# Patient Record
Sex: Male | Born: 1975
Health system: Southern US, Community
[De-identification: ages and names within clinical notes are randomized; demographics above are authoritative.]

## PROBLEM LIST (undated history)

## (undated) DIAGNOSIS — Z973 Presence of spectacles and contact lenses: Secondary | ICD-10-CM

## (undated) DIAGNOSIS — G43909 Migraine, unspecified, not intractable, without status migrainosus: Secondary | ICD-10-CM

## (undated) DIAGNOSIS — G8929 Other chronic pain: Secondary | ICD-10-CM

## (undated) DIAGNOSIS — N471 Phimosis: Secondary | ICD-10-CM

## (undated) DIAGNOSIS — N3289 Other specified disorders of bladder: Secondary | ICD-10-CM

## (undated) HISTORY — PX: FOOT SURGERY: SHX648

## (undated) HISTORY — PX: PATELLA FRACTURE SURGERY: SHX735

---
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
## 2001-03-02 ENCOUNTER — Emergency Department (HOSPITAL_COMMUNITY): Admission: EM | Admit: 2001-03-02 | Discharge: 2001-03-02 | Payer: Self-pay | Admitting: Emergency Medicine

## 2001-03-07 ENCOUNTER — Emergency Department (HOSPITAL_COMMUNITY): Admission: EM | Admit: 2001-03-07 | Discharge: 2001-03-07 | Payer: Self-pay | Admitting: Emergency Medicine

## 2001-03-12 ENCOUNTER — Inpatient Hospital Stay (HOSPITAL_COMMUNITY): Admission: RE | Admit: 2001-03-12 | Discharge: 2001-03-13 | Payer: Self-pay | Admitting: General Surgery

## 2001-03-14 ENCOUNTER — Encounter (HOSPITAL_COMMUNITY): Admission: RE | Admit: 2001-03-14 | Discharge: 2001-04-13 | Payer: Self-pay | Admitting: Pediatrics

## 2001-06-24 ENCOUNTER — Emergency Department (HOSPITAL_COMMUNITY): Admission: EM | Admit: 2001-06-24 | Discharge: 2001-06-24 | Payer: Self-pay | Admitting: Emergency Medicine

## 2002-02-15 ENCOUNTER — Emergency Department (HOSPITAL_COMMUNITY): Admission: EM | Admit: 2002-02-15 | Discharge: 2002-02-15 | Payer: Self-pay | Admitting: *Deleted

## 2002-11-27 ENCOUNTER — Emergency Department (HOSPITAL_COMMUNITY): Admission: EM | Admit: 2002-11-27 | Discharge: 2002-11-28 | Payer: Self-pay

## 2002-11-29 ENCOUNTER — Encounter: Payer: Self-pay | Admitting: Emergency Medicine

## 2002-11-29 ENCOUNTER — Emergency Department (HOSPITAL_COMMUNITY): Admission: EM | Admit: 2002-11-29 | Discharge: 2002-11-29 | Payer: Self-pay | Admitting: Emergency Medicine

## 2003-01-06 ENCOUNTER — Encounter
Admission: RE | Admit: 2003-01-06 | Discharge: 2003-04-06 | Payer: Self-pay | Admitting: Physical Medicine and Rehabilitation

## 2011-03-22 ENCOUNTER — Emergency Department (HOSPITAL_COMMUNITY)
Admission: EM | Admit: 2011-03-22 | Discharge: 2011-03-22 | Disposition: A | Discharge status: Home / Self Care | Payer: No Typology Code available for payment source | Attending: Emergency Medicine | Admitting: Emergency Medicine

## 2011-03-22 ENCOUNTER — Emergency Department (HOSPITAL_COMMUNITY): Payer: Self-pay

## 2011-03-22 DIAGNOSIS — M542 Cervicalgia: Secondary | ICD-10-CM | POA: Insufficient documentation

## 2011-03-22 DIAGNOSIS — M545 Low back pain, unspecified: Secondary | ICD-10-CM | POA: Insufficient documentation

## 2011-03-22 DIAGNOSIS — S335XXA Sprain of ligaments of lumbar spine, initial encounter: Secondary | ICD-10-CM | POA: Insufficient documentation

## 2011-03-22 DIAGNOSIS — S139XXA Sprain of joints and ligaments of unspecified parts of neck, initial encounter: Secondary | ICD-10-CM | POA: Insufficient documentation

## 2011-04-09 ENCOUNTER — Emergency Department (HOSPITAL_COMMUNITY)
Admission: EM | Admit: 2011-04-09 | Discharge: 2011-04-09 | Disposition: A | Discharge status: Home / Self Care | Payer: Medicaid Other | Attending: Emergency Medicine | Admitting: Emergency Medicine

## 2011-04-09 ENCOUNTER — Emergency Department (HOSPITAL_COMMUNITY): Payer: Medicaid Other

## 2011-04-09 DIAGNOSIS — M25579 Pain in unspecified ankle and joints of unspecified foot: Secondary | ICD-10-CM | POA: Insufficient documentation

## 2011-04-09 DIAGNOSIS — X500XXA Overexertion from strenuous movement or load, initial encounter: Secondary | ICD-10-CM | POA: Insufficient documentation

## 2011-04-09 DIAGNOSIS — M79609 Pain in unspecified limb: Secondary | ICD-10-CM | POA: Insufficient documentation

## 2011-04-09 DIAGNOSIS — Y9367 Activity, basketball: Secondary | ICD-10-CM | POA: Insufficient documentation

## 2011-04-09 DIAGNOSIS — M25473 Effusion, unspecified ankle: Secondary | ICD-10-CM | POA: Insufficient documentation

## 2011-04-09 DIAGNOSIS — M25476 Effusion, unspecified foot: Secondary | ICD-10-CM | POA: Insufficient documentation

## 2011-04-09 DIAGNOSIS — S93609A Unspecified sprain of unspecified foot, initial encounter: Secondary | ICD-10-CM | POA: Insufficient documentation

## 2011-04-19 ENCOUNTER — Encounter: Payer: Self-pay | Admitting: *Deleted

## 2011-04-19 ENCOUNTER — Emergency Department (HOSPITAL_COMMUNITY)
Admission: EM | Admit: 2011-04-19 | Discharge: 2011-04-19 | Disposition: A | Discharge status: Home / Self Care | Payer: Medicaid Other | Attending: Emergency Medicine | Admitting: Emergency Medicine

## 2011-04-19 ENCOUNTER — Emergency Department (HOSPITAL_COMMUNITY): Payer: Medicaid Other

## 2011-04-19 DIAGNOSIS — Y9239 Other specified sports and athletic area as the place of occurrence of the external cause: Secondary | ICD-10-CM | POA: Insufficient documentation

## 2011-04-19 DIAGNOSIS — W19XXXA Unspecified fall, initial encounter: Secondary | ICD-10-CM | POA: Insufficient documentation

## 2011-04-19 DIAGNOSIS — Y92838 Other recreation area as the place of occurrence of the external cause: Secondary | ICD-10-CM | POA: Insufficient documentation

## 2011-04-19 DIAGNOSIS — S93409A Sprain of unspecified ligament of unspecified ankle, initial encounter: Secondary | ICD-10-CM

## 2011-04-19 DIAGNOSIS — F17200 Nicotine dependence, unspecified, uncomplicated: Secondary | ICD-10-CM | POA: Insufficient documentation

## 2011-04-19 MED ORDER — HYDROCODONE-ACETAMINOPHEN 5-325 MG PO TABS
ORAL_TABLET | ORAL | Status: AC
Start: 2011-04-19 — End: 2011-04-29

## 2011-04-19 MED ORDER — IBUPROFEN 800 MG PO TABS: 800.0000 mg | ORAL_TABLET | Freq: Three times a day (TID) | ORAL | Status: AC

## 2011-04-19 NOTE — ED Notes (Signed)
XL ASO splint applied to left ankle. Pulse strong and skin intact.

## 2011-04-19 NOTE — ED Notes (Signed)
Pt c/o pain in his left ankle. Pt states that he was playing basketball last week and injured it. States that he was seen at [facility] last Thursday and had it x-rayed and was told that it was sprained. Pt c/o continuing pain and tenderness and states that it has not gotten any better.

## 2011-04-19 NOTE — ED Notes (Signed)
Pt a/ox4. Resp even and unlabored. D/C instructions reviewed with pt. Pt verbalized understanding. Pt ambulated with steady gate to POV. 

## 2011-04-19 NOTE — ED Provider Notes (Signed)
History     CSN: [id number] Arrival date & time: 04/19/2011  1:03 PM  Chief Complaint  Patient presents with  . Ankle Pain   HPI Comments: Patient c/o persistent ankle pain and swelling for >1 week.  Was seen at [facility] initially and treated but sx's have not improved.    Patient is a 35 y.o. male presenting with ankle pain. The history is provided by the patient.  Ankle Pain  The incident occurred more than 1 week ago. The incident occurred at the gym. The injury mechanism was torsion and a fall. The pain is present in the left ankle. The pain is moderate. The pain has been constant since onset. Pertinent negatives include no numbness, no inability to bear weight, no loss of motion, no muscle weakness, no loss of sensation and no tingling. He reports no foreign bodies present. The symptoms are aggravated by activity, bearing weight and palpation. He has tried NSAIDs for the symptoms. The treatment provided no relief.    History reviewed. No pertinent past medical history.  Past Surgical History  Procedure Date  . Foot surgery     bilateral  . Patella fracture surgery     right    History reviewed. No pertinent family history.  History  Substance Use Topics  . Smoking status: Current Everyday Smoker    Types: Cigarettes  . Smokeless tobacco: Not on file  . Alcohol Use: No      Review of Systems  Musculoskeletal: Positive for arthralgias. Negative for myalgias, back pain and gait problem.  Skin: Negative.   Neurological: Negative for dizziness, tingling, weakness and numbness.  Hematological: Does not bruise/bleed easily.  All other systems reviewed and are negative.    Physical Exam  BP 118/68  Pulse 68  Temp(Src) 97.7 F (36.5 C) (Oral)  Resp 20  Ht 6' (1.829 m)  Wt 202 lb (91.627 kg)  BMI 27.40 kg/m2  SpO2 100%  Physical Exam  Nursing note and vitals reviewed. Constitutional: He is oriented to person, place, and time. He appears well-developed and  well-nourished. No distress.  HENT:  Head: Normocephalic and atraumatic.  Mouth/Throat: Oropharynx is clear and moist.  Eyes: EOM are normal. Pupils are equal, round, and reactive to light.  Neck: Normal range of motion. No tracheal deviation present.  Cardiovascular: Normal rate, regular rhythm and normal heart sounds.   Pulmonary/Chest: Breath sounds normal.  Musculoskeletal: He exhibits edema and tenderness.  Lymphadenopathy:    He has no cervical adenopathy.  Neurological: He is alert and oriented to person, place, and time. He has normal reflexes. A cranial nerve deficit is present. He exhibits normal muscle tone. Coordination normal.  Skin: Skin is warm and dry.  Psychiatric: He has a normal mood and affect.    ED Course  ORTHOPEDIC INJURY TREATMENT Date/Time: 04/19/2011 2:47 PM Performed by: Trisha Mangle, Reda Citron L. Authorized by: Lorre Nick T Consent: Verbal consent obtained. Risks and benefits: risks, benefits and alternatives were discussed Consent given by: patient Patient understanding: patient states understanding of the procedure being performed Patient consent: the patient's understanding of the procedure matches consent given Imaging studies: imaging studies available Patient identity confirmed: verbally with patient Time out: Immediately prior to procedure a "time out" was called to verify the correct patient, procedure, equipment, support staff and site/side marked as required. Injury location: ankle Location details: left ankle Injury type: soft tissue Pre-procedure neurovascular assessment: neurovascularly intact Pre-procedure distal perfusion: normal Pre-procedure neurological function: normal Pre-procedure range of motion: normal Local  anesthesia used: no Patient sedated: no Immobilization: splint Post-procedure neurovascular assessment: post-procedure neurovascularly intact Post-procedure distal perfusion: normal Post-procedure neurological function:  normal Post-procedure range of motion: normal Patient tolerance: Patient tolerated the procedure well with no immediate complications.    MDM   1440  ttp of the medial and lateral aspects of the left ankle.  Moderate STS.  Sensation intact, CR<2 sec.  DP pulse is strong.  ASO splint also applied.  ED chart from 04/09/11 also reviewed  1440  Previous x-ray also reviewed.    The patient appears reasonably screened and/or stabilized for discharge and I doubt any other medical condition or other [facility] requiring further screening, evaluation, or treatment in the ED at this time prior to discharge.      Dg Ankle Complete Left  04/19/2011  *RADIOLOGY REPORT*  Clinical Data: Left ankle pain, injured playing basketball  LEFT ANKLE COMPLETE - 3+ VIEW  Comparison: 04/09/2011  Findings: Diffuse soft tissue swelling. Osseous mineralization normal. Ankle joint space preserved. No acute fracture, dislocation or bone destruction. Question ankle joint effusion.  IMPRESSION: Soft tissue swelling question ankle joint effusion. No acute bony abnormalities.  Original Report Authenticated By: Lollie Marrow, M.D.   Dg Ankle Complete Left  04/09/2011  *RADIOLOGY REPORT*  Clinical Data: Basketball injury.  LEFT ANKLE COMPLETE - 3+ VIEW  Comparison: None.  Findings: Soft tissue swelling overlying and beneath the lateral malleolus noted, without overlying malleolar fracture observed. The plafond and talar dome appear intact.  There is likely a small tibiotalar joint effusion.  IMPRESSION:  1.  Suspected small tibiotalar joint effusion. 2.  Soft tissue swelling laterally, but without underlying fracture identified.  Original Report Authenticated By: Dellia Cloud, M.D.        Ryder Man L. Yostin Malacara, [place] 04/19/11 1447

## 2011-05-01 NOTE — ED Provider Notes (Signed)
Medical screening examination/treatment/procedure(s) were conducted as a shared visit with non-physician practitioner(s) and myself.  I personally evaluated the patient during the encounter  Dawn Convery T Carnisha Feltz, MD 05/01/11 2331 

## 2011-08-12 ENCOUNTER — Encounter (HOSPITAL_COMMUNITY): Payer: Self-pay | Admitting: *Deleted

## 2011-08-12 ENCOUNTER — Emergency Department (HOSPITAL_COMMUNITY): Payer: Medicaid Other

## 2011-08-12 ENCOUNTER — Emergency Department (HOSPITAL_COMMUNITY)
Admission: EM | Admit: 2011-08-12 | Discharge: 2011-08-12 | Disposition: A | Discharge status: Home / Self Care | Payer: Medicaid Other | Attending: Emergency Medicine | Admitting: Emergency Medicine

## 2011-08-12 DIAGNOSIS — S0285XA Fracture of orbit, unspecified, initial encounter for closed fracture: Secondary | ICD-10-CM

## 2011-08-12 DIAGNOSIS — F17200 Nicotine dependence, unspecified, uncomplicated: Secondary | ICD-10-CM | POA: Insufficient documentation

## 2011-08-12 DIAGNOSIS — S0280XA Fracture of other specified skull and facial bones, unspecified side, initial encounter for closed fracture: Secondary | ICD-10-CM | POA: Insufficient documentation

## 2011-08-12 DIAGNOSIS — S0230XA Fracture of orbital floor, unspecified side, initial encounter for closed fracture: Secondary | ICD-10-CM | POA: Insufficient documentation

## 2011-08-12 MED ORDER — OXYCODONE-ACETAMINOPHEN 5-325 MG PO TABS: 1.0000 | ORAL_TABLET | ORAL | Status: DC | PRN

## 2011-08-12 MED ORDER — OXYCODONE-ACETAMINOPHEN 5-325 MG PO TABS
2.0000 | ORAL_TABLET | Freq: Once | ORAL | Status: AC
  Administered 2011-08-12: 2 via ORAL
  Filled 2011-08-12: qty 2

## 2011-08-12 NOTE — ED Notes (Signed)
Resting w/ ice pack in place to left eye.
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
## 2011-08-22 ENCOUNTER — Encounter (HOSPITAL_COMMUNITY): Payer: Self-pay

## 2011-08-22 ENCOUNTER — Emergency Department (HOSPITAL_COMMUNITY)
Admission: EM | Admit: 2011-08-22 | Discharge: 2011-08-22 | Disposition: A | Discharge status: Home / Self Care | Payer: Medicaid Other | Attending: Emergency Medicine | Admitting: Emergency Medicine

## 2011-08-22 ENCOUNTER — Emergency Department (HOSPITAL_COMMUNITY): Payer: Medicaid Other

## 2011-08-22 DIAGNOSIS — R10812 Left upper quadrant abdominal tenderness: Secondary | ICD-10-CM | POA: Insufficient documentation

## 2011-08-22 DIAGNOSIS — S2239XA Fracture of one rib, unspecified side, initial encounter for closed fracture: Secondary | ICD-10-CM

## 2011-08-22 DIAGNOSIS — R42 Dizziness and giddiness: Secondary | ICD-10-CM | POA: Insufficient documentation

## 2011-08-22 DIAGNOSIS — S2249XA Multiple fractures of ribs, unspecified side, initial encounter for closed fracture: Secondary | ICD-10-CM | POA: Insufficient documentation

## 2011-08-22 DIAGNOSIS — R1012 Left upper quadrant pain: Secondary | ICD-10-CM | POA: Insufficient documentation

## 2011-08-22 DIAGNOSIS — S0180XA Unspecified open wound of other part of head, initial encounter: Secondary | ICD-10-CM | POA: Insufficient documentation

## 2011-08-22 DIAGNOSIS — R079 Chest pain, unspecified: Secondary | ICD-10-CM | POA: Insufficient documentation

## 2011-08-22 DIAGNOSIS — Z9889 Other specified postprocedural states: Secondary | ICD-10-CM | POA: Insufficient documentation

## 2011-08-22 DIAGNOSIS — F17200 Nicotine dependence, unspecified, uncomplicated: Secondary | ICD-10-CM | POA: Insufficient documentation

## 2011-08-22 DIAGNOSIS — R51 Headache: Secondary | ICD-10-CM | POA: Insufficient documentation

## 2011-08-22 DIAGNOSIS — S0181XA Laceration without foreign body of other part of head, initial encounter: Secondary | ICD-10-CM

## 2011-08-22 DIAGNOSIS — M542 Cervicalgia: Secondary | ICD-10-CM | POA: Insufficient documentation

## 2011-08-22 LAB — POCT I-STAT, CHEM 8
Creatinine, Ser: 1.4 mg/dL — ABNORMAL HIGH (ref 0.50–1.35)
Hemoglobin: 15.6 g/dL (ref 13.0–17.0)
Potassium: 4.1 mEq/L (ref 3.5–5.1)
Sodium: 143 mEq/L (ref 135–145)

## 2011-08-22 MED ORDER — LIDOCAINE HCL (PF) 1 % IJ SOLN
2.0000 mL | Freq: Once | INTRAMUSCULAR | Status: AC
  Administered 2011-08-22: 2 mL via INTRADERMAL

## 2011-08-22 MED ORDER — OXYCODONE-ACETAMINOPHEN 5-325 MG PO TABS
1.0000 | ORAL_TABLET | Freq: Once | ORAL | Status: AC
  Administered 2011-08-22: 1 via ORAL

## 2011-08-22 MED ORDER — OXYCODONE-ACETAMINOPHEN 5-325 MG PO TABS
ORAL_TABLET | ORAL | Status: AC
  Administered 2011-08-22: 1 via ORAL
  Filled 2011-08-22: qty 1

## 2011-08-22 MED ORDER — HYDROMORPHONE HCL PF 1 MG/ML IJ SOLN
0.5000 mg | Freq: Once | INTRAMUSCULAR | Status: AC
  Administered 2011-08-22: 0.5 mg via INTRAVENOUS
  Filled 2011-08-22: qty 1

## 2011-08-22 MED ORDER — HYDROCODONE-ACETAMINOPHEN 5-325 MG PO TABS: 1.0000 | ORAL_TABLET | Freq: Four times a day (QID) | ORAL | Status: AC | PRN

## 2011-08-22 MED ORDER — IOHEXOL 300 MG/ML  SOLN
100.0000 mL | Freq: Once | INTRAMUSCULAR | Status: AC | PRN
  Administered 2011-08-22: 100 mL via INTRAVENOUS

## 2011-08-22 MED ORDER — LIDOCAINE HCL (PF) 1 % IJ SOLN
INTRAMUSCULAR | Status: AC
  Administered 2011-08-22: 2 mL via INTRADERMAL
  Filled 2011-08-22: qty 5
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
## 2011-08-22 NOTE — ED Notes (Signed)
Initial vitals charted on wrong pt.
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
## 2011-08-28 ENCOUNTER — Encounter (HOSPITAL_COMMUNITY): Payer: Self-pay | Admitting: *Deleted

## 2011-08-28 ENCOUNTER — Emergency Department (HOSPITAL_COMMUNITY)
Admission: EM | Admit: 2011-08-28 | Discharge: 2011-08-28 | Disposition: A | Discharge status: Home / Self Care | Payer: Self-pay | Attending: Emergency Medicine | Admitting: Emergency Medicine

## 2011-08-28 DIAGNOSIS — Z4802 Encounter for removal of sutures: Secondary | ICD-10-CM | POA: Insufficient documentation

## 2011-08-28 MED ORDER — HYDROCODONE-ACETAMINOPHEN 7.5-500 MG PO TABS: 1.0000 | ORAL_TABLET | ORAL | Status: AC | PRN

## 2011-08-28 NOTE — ED Notes (Signed)
Headache, pain lt lat ribs due to rib fx,  Says he has problem with back pain and numbness of arms and legs at times.  Plans surgery for orbital fx next week.

## 2011-08-29 NOTE — ED Provider Notes (Signed)
History     CSN: [id number]  Arrival date & time 08/28/11  1608   First MD Initiated Contact with Patient 08/28/11 1709      Chief Complaint  Patient presents with  . Suture / Staple Removal    (Consider location/radiation/quality/duration/timing/severity/associated sxs/prior treatment) Patient is a 36 y.o. male presenting with suture removal. The history is provided by the patient.  Suture / Staple Removal  The sutures were placed 3 to 6 days ago. There has been no treatment since the wound repair. There has been no drainage from the wound. There is no redness present. There is no swelling present. The pain has no pain.    History reviewed. No pertinent past medical history.  Past Surgical History  Procedure Date  . Foot surgery     bilateral  . Patella fracture surgery     right    History reviewed. No pertinent family history.  History  Substance Use Topics  . Smoking status: Current Everyday Smoker    Types: Cigarettes  . Smokeless tobacco: Not on file  . Alcohol Use: No      Review of Systems  Constitutional: Negative for fever.  HENT: Negative for congestion, sore throat and neck pain.   Eyes: Negative.   Respiratory: Negative for chest tightness and shortness of breath.   Cardiovascular: Positive for chest pain.  Gastrointestinal: Negative for nausea and abdominal pain.  Genitourinary: Negative.   Musculoskeletal: Negative for joint swelling and arthralgias.  Skin: Positive for wound. Negative for rash.  Neurological: Negative for dizziness, weakness, light-headedness, numbness and headaches.  Hematological: Negative.   Psychiatric/Behavioral: Negative.     Allergies  Review of patient's allergies indicates no known allergies.  Home Medications   Current Outpatient Rx  Name Route Sig Dispense Refill  . HYDROCODONE-ACETAMINOPHEN 5-325 MG PO TABS Oral Take 1 tablet by mouth every 6 (six) hours as needed for pain. 20 tablet 0  .  HYDROCODONE-ACETAMINOPHEN 7.5-500 MG PO TABS Oral Take 1 tablet by mouth every 4 (four) hours as needed for pain. 30 tablet 0    BP 137/87  Temp(Src) 98.6 F (37 C) (Oral)  Resp 18  Wt 205 lb (92.987 kg)  SpO2 100%  Physical Exam  Nursing note and vitals reviewed. Constitutional: He is oriented to person, place, and time. He appears well-developed and well-nourished.  HENT:  Head: Normocephalic.  Eyes: Conjunctivae are normal.  Neck: Normal range of motion.  Cardiovascular: Normal rate, regular rhythm, normal heart sounds and intact distal pulses.   Pulmonary/Chest: Effort normal and breath sounds normal. He has no wheezes.  Musculoskeletal: Normal range of motion.  Neurological: He is alert and oriented to person, place, and time.  Skin: Skin is warm and dry.       Well healed laceration above left eyebrow  Psychiatric: He has a normal mood and affect.    ED Course  SUTURE REMOVAL Performed by: Serrita Lueth L Authorized by: Candis Musa Consent: Verbal consent obtained. Risks and benefits: risks, benefits and alternatives were discussed Consent given by: patient Time out: Immediately prior to procedure a "time out" was called to verify the correct patient, procedure, equipment, support staff and site/side marked as required. Body area: head/neck Location details: left eyebrow Wound Appearance: clean Sutures Removed: 3 Facility: sutures placed in this facility Patient tolerance: Patient tolerated the procedure well with no immediate complications.   (including critical care time)  Labs Reviewed - No data to display No results found.   1.  Visit for suture removal       MDM  Patient with continued pain in left posterior rib cage secondary to rib fractures obtained with laceration injury.  Will prescribe oxycodone.  Scheduled for surgery next week with ENT (for orbital fracture).          Candis Musa, PA 08/29/11 0300  Candis Musa, PA 08/29/11 0300

## 2011-08-30 NOTE — ED Provider Notes (Signed)
Medical screening examination/treatment/procedure(s) were performed by non-physician practitioner and as supervising physician I was immediately available for consultation/collaboration.  Hedi Barkan S. Aulden Calise, MD 08/30/11 1553 

## 2012-04-04 ENCOUNTER — Emergency Department (HOSPITAL_COMMUNITY)
Admission: EM | Admit: 2012-04-04 | Discharge: 2012-04-04 | Disposition: A | Discharge status: Home / Self Care | Payer: Medicaid Other | Attending: Emergency Medicine | Admitting: Emergency Medicine

## 2012-04-04 ENCOUNTER — Emergency Department (HOSPITAL_COMMUNITY): Payer: Medicaid Other

## 2012-04-04 ENCOUNTER — Encounter (HOSPITAL_COMMUNITY): Payer: Self-pay | Admitting: *Deleted

## 2012-04-04 DIAGNOSIS — Y93B3 Activity, free weights: Secondary | ICD-10-CM | POA: Insufficient documentation

## 2012-04-04 DIAGNOSIS — F17200 Nicotine dependence, unspecified, uncomplicated: Secondary | ICD-10-CM | POA: Insufficient documentation

## 2012-04-04 DIAGNOSIS — W208XXA Other cause of strike by thrown, projected or falling object, initial encounter: Secondary | ICD-10-CM | POA: Insufficient documentation

## 2012-04-04 DIAGNOSIS — S20219A Contusion of unspecified front wall of thorax, initial encounter: Secondary | ICD-10-CM

## 2012-04-04 MED ORDER — TRAMADOL HCL 50 MG PO TABS: 50.0000 mg | ORAL_TABLET | Freq: Four times a day (QID) | ORAL | Status: AC | PRN

## 2012-04-04 NOTE — Discharge Instructions (Signed)
As discussed, please return for any concerning changes in your condition.  Please take ibuprofen, 600 mg, every 8 hours for the next 3 days.  You may use the tramadol for pain that does not improve with ibuprofen.  Please use your incentive spirometer every 6 hours for the next 3 days

## 2012-04-04 NOTE — ED Notes (Signed)
Pt educated on how to use incentive spirometer, pt demonstrated correct technique

## 2012-04-04 NOTE — ED Provider Notes (Signed)
History    This chart was scribed for Gerhard Munch, MD, MD by Smitty Pluck. The patient was seen in room TR05C and the patient's care was started at 3:23PM.   CSN: [id number]  Arrival date & time 04/04/12  1350   None     Chief Complaint  Patient presents with  . Chest Injury    barbell of 200-215 lbs    (Consider location/radiation/quality/duration/timing/severity/associated sxs/prior treatment) The history is provided by the patient.   Adam Snow is a 36 y.o. male who presents to the Emergency Department complaining of chest injury onset 10:45AM. Pt was working out at gym and dropping 215 lb barbell on his chest. Pt reports constant, moderate chest pain. Denies radiation. Reports pain is aggravated by deep breathing. Rates pain at 8/10. Denies taking medication for pain PTA.   History reviewed. No pertinent past medical history.  Past Surgical History  Procedure Date  . Foot surgery     bilateral  . Patella fracture surgery     right    No family history on file.  History  Substance Use Topics  . Smoking status: Current Everyday Smoker    Types: Cigarettes  . Smokeless tobacco: Not on file  . Alcohol Use: No      Review of Systems  Constitutional:       Per HPI, otherwise negative  HENT:       Per HPI, otherwise negative  Eyes: Negative.   Respiratory:       Per HPI, otherwise negative  Cardiovascular:       Per HPI, otherwise negative  Gastrointestinal: Negative for vomiting.  Genitourinary: Negative.   Musculoskeletal:       Per HPI, otherwise negative  Skin: Negative.   Neurological: Negative for syncope.    Allergies  Review of patient's allergies indicates no known allergies.  Home Medications   Current Outpatient Rx  Name Route Sig Dispense Refill  . OVER THE COUNTER MEDICATION Left Eye Place 1 drop into the left eye every morning. OTC Eye drop for irritation    . OVER THE COUNTER MEDICATION Left Eye Place 2 drops into the  left eye at bedtime. OTC eye drop for lubrication      BP 122/70  Pulse 52  Temp 98.2 F (36.8 C) (Oral)  Resp 18  SpO2 98%  Physical Exam  Nursing note and vitals reviewed. Constitutional: He is oriented to person, place, and time. He appears well-developed and well-nourished.  HENT:  Head: Normocephalic and atraumatic.  Cardiovascular: Normal rate, regular rhythm and normal heart sounds.   Pulmonary/Chest: Effort normal and breath sounds normal. No respiratory distress. He exhibits tenderness (mid sternal).  Neurological: He is alert and oriented to person, place, and time.  Skin: Skin is warm and dry.  Psychiatric: He has a normal mood and affect. His behavior is normal.    ED Course  Procedures (including critical care time)  COORDINATION OF CARE: 3:26PM EDP discusses pt ED treatment with pt     Labs Reviewed - No data to display Dg Chest 2 View  04/04/2012  *RADIOLOGY REPORT*  Clinical Data: Chest pain following weight lifting injury  CHEST - 2 VIEW  Comparison: Chest CT - 08/22/2011  Findings: Normal cardiac silhouette and mediastinal contours.  No focal parenchymal opacities.  No definite pleural effusion or pneumothorax.  Redemonstrated incomplete fusion of the mid aspect of the sternum with associated degenerative change at this location, unchanged compared to remote chest CT.  No  acute osseous abnormalities.  IMPRESSION: No acute cardiopulmonary disease.  Original Report Authenticated By: Waynard Reeds, M.D.     No diagnosis found.  I reviewed the cxr with the patient.  MDM  I personally performed the services described in this documentation, which was scribed in my presence. The recorded information has been reviewed and considered.  The patient presents after sustaining trauma to his chest.  On my exam the patient is in no distress.  He does have mild tenderness to palpation about the anterior chest, but no hypoxia, tachypnea, or tachycardia.  The patient's  x-ray did not demonstrate changes compared to the recent CT.  He was counseled on the need to use his incentive parameter, and to return for any concerning changes in his condition, discharged in stable condition.  Gerhard Munch, MD 04/04/12 1534

## 2012-04-04 NOTE — ED Notes (Signed)
Pt had barbell fall on chest today that weight 200-215 pounds.  No markings.  Pain with palpation and movement.  Hurts to hiccup and with deep breathing.

## 2012-04-04 NOTE — ED Notes (Signed)
MD at bedside. 

## 2012-12-15 ENCOUNTER — Emergency Department (HOSPITAL_COMMUNITY)
Admission: EM | Admit: 2012-12-15 | Discharge: 2012-12-15 | Disposition: A | Discharge status: Home / Self Care | Payer: Medicaid - Out of State | Attending: Emergency Medicine | Admitting: Emergency Medicine

## 2012-12-15 ENCOUNTER — Encounter (HOSPITAL_COMMUNITY): Payer: Self-pay | Admitting: *Deleted

## 2012-12-15 DIAGNOSIS — F17200 Nicotine dependence, unspecified, uncomplicated: Secondary | ICD-10-CM | POA: Insufficient documentation

## 2012-12-15 DIAGNOSIS — G43909 Migraine, unspecified, not intractable, without status migrainosus: Secondary | ICD-10-CM

## 2012-12-15 DIAGNOSIS — H53149 Visual discomfort, unspecified: Secondary | ICD-10-CM | POA: Insufficient documentation

## 2012-12-15 DIAGNOSIS — H532 Diplopia: Secondary | ICD-10-CM | POA: Insufficient documentation

## 2012-12-15 DIAGNOSIS — Z79899 Other long term (current) drug therapy: Secondary | ICD-10-CM | POA: Insufficient documentation

## 2012-12-15 HISTORY — DX: Migraine, unspecified, not intractable, without status migrainosus: G43.909

## 2012-12-15 MED ORDER — IBUPROFEN 600 MG PO TABS: 600.0000 mg | ORAL_TABLET | Freq: Four times a day (QID) | ORAL | Status: DC | PRN

## 2012-12-15 MED ORDER — HYDROCODONE-ACETAMINOPHEN 5-325 MG PO TABS: 1.0000 | ORAL_TABLET | ORAL | Status: DC | PRN

## 2012-12-15 MED ORDER — KETOROLAC TROMETHAMINE 60 MG/2ML IM SOLN
60.0000 mg | Freq: Once | INTRAMUSCULAR | Status: AC
  Administered 2012-12-15: 60 mg via INTRAMUSCULAR
  Filled 2012-12-15: qty 2

## 2012-12-15 MED ORDER — METOCLOPRAMIDE HCL 10 MG PO TABS
10.0000 mg | ORAL_TABLET | Freq: Once | ORAL | Status: AC
  Administered 2012-12-15: 10 mg via ORAL
  Filled 2012-12-15: qty 1

## 2012-12-15 MED ORDER — METOCLOPRAMIDE HCL 10 MG PO TABS: 10.0000 mg | ORAL_TABLET | Freq: Four times a day (QID) | ORAL | Status: DC | PRN

## 2012-12-15 MED ORDER — HYDROCODONE-ACETAMINOPHEN 5-325 MG PO TABS
1.0000 | ORAL_TABLET | Freq: Once | ORAL | Status: AC
  Administered 2012-12-15: 1 via ORAL
  Filled 2012-12-15: qty 1

## 2012-12-15 NOTE — ED Notes (Signed)
Pt states with migraine HA for 2 weeks and with blurry vision and burning eyes also x 2 weeks to bilateral eyes, left eye pain, denies N/V, hx of migraines

## 2012-12-15 NOTE — ED Provider Notes (Signed)
History  This chart was scribed for Adam Racer, MD by Shari Heritage, ED Scribe. The patient was seen in room APA06/APA06. Patient's care was started at 1547.   CSN: [id number]  Arrival date & time 12/15/12  1525   First MD Initiated Contact with Patient 12/15/12 1547      Chief Complaint  Patient presents with  . Migraine  . Eye Problem    Patient is a 37 y.o. male presenting with eye problem and headaches.  Eye Problem Location:  L eye Severity:  Severe Duration:  2 weeks Timing:  Constant Progression:  Unchanged Chronicity:  New Ineffective treatments:  NSAIDs and eye drops Associated symptoms: blurred vision, headaches and photophobia   Associated symptoms: no nausea and no vomiting   Headache Pain location:  Frontal, L temporal and L parietal Radiates to:  Does not radiate Onset quality:  Gradual Duration:  2 weeks Progression:  Unchanged Context: bright light   Associated symptoms: blurred vision, eye pain and photophobia   Associated symptoms: no abdominal pain, no back pain, no congestion, no cough, no fever, no loss of balance, no nausea, no near-syncope, no seizures, no syncope, no tingling and no vomiting      HPI Comments: LALO TROMP is a 37 y.o. male with history of migraines who presents to the Emergency Department complaining of moderate to severe, left-sided headache and left eye pain onset 2 weeks ago. There is associated blurred vision. He states that he has been taking ibuprofen and applied natural tears and lubricant gel to his eye. He has a history of chronic eye pain secondary to left eye trauma. Per medical records, patient had multiple fractures to the left periorbital floor and was treated for this issue a [facility] in December 2012.  Patient states that migraines have been worse since his injury. Patient sees ophthalmologist, Earlene Plater, at [facility] Patient denies nausea, vomiting, loss of vision, loss of balance, syncope, dizziness,  lightheadedness, chest pain, shortness of breath or any other symptoms. Patient is a current every day smoker and does not use alcohol.  Ophthalmologist - Fayrene Fearing   Past Medical History  Diagnosis Date  . Migraine     Past Surgical History  Procedure Laterality Date  . Foot surgery      bilateral  . Patella fracture surgery      right    History reviewed. No pertinent family history.  History  Substance Use Topics  . Smoking status: Current Every Day Smoker    Types: Cigarettes  . Smokeless tobacco: Not on file  . Alcohol Use: No      Review of Systems  Constitutional: Negative for fever and chills.  HENT: Negative for congestion and rhinorrhea.   Eyes: Positive for blurred vision, photophobia and pain.  Respiratory: Negative for cough and shortness of breath.   Cardiovascular: Negative for chest pain, leg swelling, syncope and near-syncope.  Gastrointestinal: Negative for nausea, vomiting and abdominal pain.  Musculoskeletal: Negative for back pain.  Skin: Negative for rash.  Neurological: Positive for headaches. Negative for seizures and loss of balance.  All other systems reviewed and are negative.    Allergies  Other  Home Medications   Current Outpatient Rx  Name  Route  Sig  Dispense  Refill  . Carboxymethylcellul-Glycerin (OPTIVE) 0.5-0.9 % SOLN   Ophthalmic   Apply 1 drop to eye at bedtime.         . carboxymethylcellulose (REFRESH TEARS) 0.5 % SOLN   Ophthalmic  Apply 1 drop to eye 3 (three) times daily as needed (for drye eye relief).         Marland Kitchen HYDROcodone-acetaminophen (NORCO) 5-325 MG per tablet   Oral   Take 1 tablet by mouth every 4 (four) hours as needed for pain.   10 tablet   0   . ibuprofen (ADVIL,MOTRIN) 600 MG tablet   Oral   Take 1 tablet (600 mg total) by mouth every 6 (six) hours as needed for pain.   30 tablet   0   . metoCLOPramide (REGLAN) 10 MG tablet   Oral   Take 1 tablet (10 mg total) by mouth  every 6 (six) hours as needed (nausea/headache).   6 tablet   0     BP 130/81  Pulse 58  Temp(Src) 98.4 F (36.9 C) (Oral)  Resp 18  Ht 6' (1.829 m)  Wt 205 lb (92.987 kg)  BMI 27.8 kg/m2  SpO2 100%  Physical Exam  Constitutional: He is oriented to person, place, and time. He appears well-developed and well-nourished. No distress.  HENT:  Head: Normocephalic and atraumatic.  Eyes: Conjunctivae, EOM and lids are normal. Pupils are equal, round, and reactive to light.  Neck: Normal range of motion. Neck supple.  Cardiovascular: Normal rate and regular rhythm.   Pulmonary/Chest: Effort normal and breath sounds normal.  Abdominal: Soft. There is no tenderness. There is no rebound and no guarding.  Neurological: He is alert and oriented to person, place, and time.  5/5 strength in all extremities. Sensation intact. CN 2-12 intact. Finger to nose intact bilaterally. 5/5 grip strength.   Skin: Skin is warm and dry.    ED Course  Procedures (including critical care time) DIAGNOSTIC STUDIES: Oxygen Saturation is 100% on room air, normal by my interpretation.    COORDINATION OF CARE: 4:05 PM- Patient informed of current plan for treatment and evaluation and agrees with plan at this time.   5:17 PM- Headache is improved but still complaining of visual disturbance. Has agreed to make an appointment to see ophthalmologist and has already called. Patient is stable and ready for discharge.   1. Migraine   2. Double vision       MDM  I personally performed the services described in this documentation, which was scribed in my presence. The recorded information has been reviewed and is accurate.  Double vision is chronic >1year and is followed by ophthalmologist at [facility]. No acute findings on exam. No entrapment. EOMI. No neuro deficits. Advised to f/u with [facility] MD.   Adam Racer, MD 12/23/12 1650

## 2012-12-15 NOTE — ED Provider Notes (Deleted)
History     CSN: [id number]  Arrival date & time 12/15/12  1525   First MD Initiated Contact with Patient 12/15/12 1547      Chief Complaint  Patient presents with  . Migraine  . Eye Problem    (Consider location/radiation/quality/duration/timing/severity/associated sxs/prior treatment) HPI  Past Medical History  Diagnosis Date  . Migraine     Past Surgical History  Procedure Laterality Date  . Foot surgery      bilateral  . Patella fracture surgery      right    History reviewed. No pertinent family history.  History  Substance Use Topics  . Smoking status: Current Every Day Smoker    Types: Cigarettes  . Smokeless tobacco: Not on file  . Alcohol Use: No      Review of Systems  Allergies  Other  Home Medications   Current Outpatient Rx  Name  Route  Sig  Dispense  Refill  . Carboxymethylcellul-Glycerin (OPTIVE) 0.5-0.9 % SOLN   Ophthalmic   Apply 1 drop to eye at bedtime.         . carboxymethylcellulose (REFRESH TEARS) 0.5 % SOLN   Ophthalmic   Apply 1 drop to eye 3 (three) times daily as needed (for drye eye relief).         Marland Kitchen HYDROcodone-acetaminophen (NORCO) 5-325 MG per tablet   Oral   Take 1 tablet by mouth every 4 (four) hours as needed for pain.   10 tablet   0   . ibuprofen (ADVIL,MOTRIN) 600 MG tablet   Oral   Take 1 tablet (600 mg total) by mouth every 6 (six) hours as needed for pain.   30 tablet   0   . metoCLOPramide (REGLAN) 10 MG tablet   Oral   Take 1 tablet (10 mg total) by mouth every 6 (six) hours as needed (nausea/headache).   6 tablet   0     BP 130/81  Pulse 58  Temp(Src) 98.4 F (36.9 C) (Oral)  Resp 18  Ht 6' (1.829 m)  Wt 205 lb (92.987 kg)  BMI 27.8 kg/m2  SpO2 100%  Physical Exam  ED Course  Procedures (including critical care time)  Labs Reviewed - No data to display No results found.   1. Migraine   2. Double vision       MDM  I personally performed the services described in  this documentation, which was scribed in my presence. The recorded information has been reviewed and is accurate.  Double vision is chronic >1year and is followed by ophthalmologist at [facility]. No acute findings on exam. No entrapment. EOMI. No neuro deficits. Advised to f/u with [facility] MD.         Loren Racer, MD 12/15/12 [phone]

## 2012-12-15 NOTE — ED Notes (Addendum)
Pt states he had fractured eye socket in Dec of last year. Pt currently complains of migraines with burning sensation in both eyes for the past two weeks. Pt states he has used eye drops as well as Ibuprofen to relieve pain and burning but the medication has been ineffective. Pt pupils round equal and reactive to light.

## 2013-03-17 ENCOUNTER — Encounter (HOSPITAL_COMMUNITY): Payer: Self-pay | Admitting: Emergency Medicine

## 2013-03-17 ENCOUNTER — Emergency Department (HOSPITAL_COMMUNITY)
Admission: EM | Admit: 2013-03-17 | Discharge: 2013-03-17 | Disposition: A | Discharge status: Home / Self Care | Payer: Medicaid - Out of State | Attending: Emergency Medicine | Admitting: Emergency Medicine

## 2013-03-17 DIAGNOSIS — IMO0002 Reserved for concepts with insufficient information to code with codable children: Secondary | ICD-10-CM

## 2013-03-17 DIAGNOSIS — Y939 Activity, unspecified: Secondary | ICD-10-CM | POA: Insufficient documentation

## 2013-03-17 DIAGNOSIS — Y990 Civilian activity done for income or pay: Secondary | ICD-10-CM | POA: Insufficient documentation

## 2013-03-17 DIAGNOSIS — F17200 Nicotine dependence, unspecified, uncomplicated: Secondary | ICD-10-CM | POA: Insufficient documentation

## 2013-03-17 DIAGNOSIS — S0100XA Unspecified open wound of scalp, initial encounter: Secondary | ICD-10-CM | POA: Insufficient documentation

## 2013-03-17 DIAGNOSIS — G43909 Migraine, unspecified, not intractable, without status migrainosus: Secondary | ICD-10-CM | POA: Insufficient documentation

## 2013-03-17 DIAGNOSIS — W208XXA Other cause of strike by thrown, projected or falling object, initial encounter: Secondary | ICD-10-CM | POA: Insufficient documentation

## 2013-03-17 DIAGNOSIS — Y929 Unspecified place or not applicable: Secondary | ICD-10-CM | POA: Insufficient documentation

## 2013-03-17 NOTE — ED Notes (Addendum)
PA at bedside to assess and suture.

## 2013-03-17 NOTE — ED Provider Notes (Signed)
History    CSN: [id number] Arrival date & time 03/17/13  1029  First MD Initiated Contact with Patient 03/17/13 1034     Chief Complaint  Patient presents with  . Head Injury   (Consider location/radiation/quality/duration/timing/severity/associated sxs/prior Treatment) HPI Comments: Patient is a 37 year old male who presents today after a brick fell on his head while at work. He states he was putting a door up or himself from about 1 foot above his head on the top of the head. He has a laceration to the back of his head. No loss of consciousness, disorientation, headaches, nausea, vomiting, abdominal pain, numbness, weakness, tingling. Currently he states he is in no pain. No medications PTA. He has been ambulatory. He would like to return to work. Reports he has no significant past medical history including diabetes. His last tetanus shot was 4 weeks ago.  The history is provided by the patient. No language interpreter was used.   Past Medical History  Diagnosis Date  . Migraine    Past Surgical History  Procedure Laterality Date  . Foot surgery      bilateral  . Patella fracture surgery      right   History reviewed. No pertinent family history. History  Substance Use Topics  . Smoking status: Current Every Day Smoker    Types: Cigarettes  . Smokeless tobacco: Not on file  . Alcohol Use: No    Review of Systems  Constitutional: Negative for fever and chills.  Eyes: Negative for photophobia, pain, discharge, redness, itching and visual disturbance.  Respiratory: Negative for shortness of breath.   Cardiovascular: Negative for chest pain.  Gastrointestinal: Negative for nausea, vomiting and abdominal pain.  Skin: Positive for wound.  Neurological: Negative for headaches.  All other systems reviewed and are negative.    Allergies  Other  Home Medications   Current Outpatient Rx  Name  Route  Sig  Dispense  Refill  . Carboxymethylcellul-Glycerin (OPTIVE)  0.5-0.9 % SOLN   Ophthalmic   Apply 1 drop to eye at bedtime.         . carboxymethylcellulose (REFRESH TEARS) 0.5 % SOLN   Ophthalmic   Apply 1 drop to eye 3 (three) times daily as needed (for drye eye relief).         Marland Kitchen HYDROcodone-acetaminophen (NORCO) 5-325 MG per tablet   Oral   Take 1 tablet by mouth every 4 (four) hours as needed for pain.   10 tablet   0   . ibuprofen (ADVIL,MOTRIN) 600 MG tablet   Oral   Take 1 tablet (600 mg total) by mouth every 6 (six) hours as needed for pain.   30 tablet   0   . metoCLOPramide (REGLAN) 10 MG tablet   Oral   Take 1 tablet (10 mg total) by mouth every 6 (six) hours as needed (nausea/headache).   6 tablet   0    BP 137/88  Pulse 67  Temp(Src) 98.3 F (36.8 C) (Oral)  Resp 18  Ht 6' (1.829 m)  Wt 205 lb (92.987 kg)  BMI 27.8 kg/m2  SpO2 98% Physical Exam  Nursing note and vitals reviewed. Constitutional: He is oriented to person, place, and time. He appears well-developed and well-nourished. No distress.  HENT:  Head: Normocephalic.    Right Ear: External ear normal.  Left Ear: External ear normal.  Nose: Nose normal.  Mouth/Throat: Uvula is midline, oropharynx is clear and moist and mucous membranes are normal.  2 cm laceration  of posterior scalp  Eyes: Conjunctivae, EOM and lids are normal. Pupils are equal, round, and reactive to light.  Neck: Normal range of motion. No tracheal deviation present.  Cardiovascular: Normal rate, regular rhythm, normal heart sounds, intact distal pulses and normal pulses.   Pulmonary/Chest: Effort normal and breath sounds normal. No stridor.  Abdominal: Soft. He exhibits no distension. There is no tenderness.  Musculoskeletal: Normal range of motion.  Moves all extremites. 5/5 strength  Neurological: He is alert and oriented to person, place, and time. He has normal strength. No cranial nerve deficit (III-XII) or sensory deficit. He exhibits normal muscle tone. Coordination and  gait normal.  Skin: Skin is warm and dry. He is not diaphoretic.  Psychiatric: He has a normal mood and affect. His behavior is normal.    ED Course  Procedures (including critical care time)  LACERATION REPAIR Performed by: Junious Silk Authorized by: Junious Silk Consent: Verbal consent obtained. Risks and benefits: risks, benefits and alternatives were discussed Consent given by: patient Patient identity confirmed: provided demographic data Prepped and Draped in normal sterile fashion Wound explored  Laceration Location: scalp  Laceration Length: 2 cm  No Foreign Bodies seen or palpated  Anesthesia: local infiltration  Local anesthetic: lidocaine 2%   Anesthetic total: 3 ml  Irrigation method: syringe Amount of cleaning: standard  Skin closure: 4-0 Prolene  Number of sutures: 4  Technique: simple interrupted   Patient tolerance: Patient tolerated the procedure well with no immediate complications.   Labs Reviewed - No data to display No results found. 1. Laceration     MDM  Tdap UTD. Wound cleaning complete with pressure irrigation, bottom of wound visualized, no foreign bodies appreciated. Laceration occurred < 8 hours prior to repair which was well tolerated. Pt has no co morbidities to effect normal wound healing. Discussed suture home care w pt and answered questions. Pt to f-u for wound check and suture removal in 10 days. Pt is hemodynamically stable w no complaints prior to dc. No concern for intracranial injury. No neuro deficits. Return instructions given. Vital signs stable for discharge. Patient / Family / Caregiver informed of clinical course, understand medical decision-making process, and agree with plan.     Mora Bellman, PA-C 03/17/13 1538

## 2013-03-17 NOTE — ED Notes (Signed)
Per EMS: brick fell on pt head very short distance; pt with small abrasion to top of head; no LOC

## 2013-03-17 NOTE — ED Provider Notes (Signed)
Medical screening examination/treatment/procedure(s) were performed by non-physician practitioner and as supervising physician I was immediately available for consultation/collaboration.   Alishba Naples, MD 03/17/13 1604 

## 2013-03-17 NOTE — ED Notes (Signed)
Pt denies nausea, vomiting, LOC, or headache. Denies pain.

## 2013-03-26 ENCOUNTER — Emergency Department (HOSPITAL_COMMUNITY)
Admission: EM | Admit: 2013-03-26 | Discharge: 2013-03-26 | Disposition: A | Discharge status: Home / Self Care | Payer: Medicaid - Out of State | Attending: Emergency Medicine | Admitting: Emergency Medicine

## 2013-03-26 ENCOUNTER — Encounter (HOSPITAL_COMMUNITY): Payer: Self-pay | Admitting: Emergency Medicine

## 2013-03-26 DIAGNOSIS — Z4802 Encounter for removal of sutures: Secondary | ICD-10-CM | POA: Insufficient documentation

## 2013-03-26 DIAGNOSIS — Z8679 Personal history of other diseases of the circulatory system: Secondary | ICD-10-CM | POA: Insufficient documentation

## 2013-03-26 DIAGNOSIS — F17200 Nicotine dependence, unspecified, uncomplicated: Secondary | ICD-10-CM | POA: Insufficient documentation

## 2013-03-26 NOTE — ED Notes (Signed)
Patient here for suture removal from L top of head.   Patient states he had them placed 10 days ago.

## 2013-03-26 NOTE — ED Provider Notes (Signed)
  CSN: [id number]     Arrival date & time 03/26/13  0919 History     First MD Initiated Contact with Patient 03/26/13 [phone]     Chief Complaint  Patient presents with  . Suture / Staple Removal   (Consider location/radiation/quality/duration/timing/severity/associated sxs/prior Treatment) HPI Comments: Patient presents today to have sutures removed from his scalp.  Sutures were placed nine days ago.  He denies any drainage from the area.  Denies pain.  Denies surrounding erythema or edema.  Denies fever or chills.  He has been keeping the area clean and dry, but has not been applying any antibiotic ointment or other treatment.    Patient is a 37 y.o. male presenting with suture removal. The history is provided by the patient.  Suture / Staple Removal This is a new problem.    Past Medical History  Diagnosis Date  . Migraine    Past Surgical History  Procedure Laterality Date  . Foot surgery      bilateral  . Patella fracture surgery      right   No family history on file. History  Substance Use Topics  . Smoking status: Current Every Day Smoker -- 0.50 packs/day    Types: Cigarettes  . Smokeless tobacco: Not on file  . Alcohol Use: No    Review of Systems  Skin: Positive for wound.  All other systems reviewed and are negative.    Allergies  Other  Home Medications   Current Outpatient Rx  Name  Route  Sig  Dispense  Refill  . Carboxymethylcellul-Glycerin (OPTIVE) 0.5-0.9 % SOLN   Ophthalmic   Apply 1 drop to eye at bedtime.         . carboxymethylcellulose (REFRESH TEARS) 0.5 % SOLN   Ophthalmic   Apply 1 drop to eye 3 (three) times daily as needed (for drye eye relief).         Marland Kitchen ibuprofen (ADVIL,MOTRIN) 200 MG tablet   Oral   Take 400 mg by mouth every 6 (six) hours as needed for pain.          BP 118/75  Pulse 69  Temp(Src) 97.2 F (36.2 C) (Oral)  Resp 20  SpO2 97% Physical Exam  Nursing note and vitals reviewed. Constitutional: He appears  well-developed and well-nourished.  HENT:  Head: Normocephalic.  Left sided scalp laceration appears to be healing well.  Skin well approximated.  No drainage from the area.  No surrounding erythema, edema, or warmth.  Neck: Normal range of motion. Neck supple.  Cardiovascular: Normal rate, regular rhythm and normal heart sounds.   Pulmonary/Chest: Effort normal and breath sounds normal.  Neurological: He is alert.  Skin: Skin is warm and dry. No erythema.  Psychiatric: He has a normal mood and affect.    ED Course   Procedures (including critical care time)  Labs Reviewed - No data to display No results found. No diagnosis found.  MDM  Patient presenting for suture removal.  No signs of infection.  Patient stable for discharge.  Pascal Lux [place], PA-C 03/26/13 1103

## 2013-03-26 NOTE — ED Provider Notes (Signed)
Medical screening examination/treatment/procedure(s) were performed by non-physician practitioner and as supervising physician I was immediately available for consultation/collaboration.   Mayjor Ager M Anthonio Mizzell, MD 03/26/13 1845 

## 2013-03-27 ENCOUNTER — Emergency Department (HOSPITAL_COMMUNITY): Payer: Medicaid - Out of State

## 2013-03-27 ENCOUNTER — Emergency Department (HOSPITAL_COMMUNITY)
Admission: EM | Admit: 2013-03-27 | Discharge: 2013-03-27 | Disposition: A | Discharge status: Home / Self Care | Payer: No Typology Code available for payment source | Attending: Emergency Medicine | Admitting: Emergency Medicine

## 2013-03-27 ENCOUNTER — Encounter (HOSPITAL_COMMUNITY): Payer: Self-pay | Admitting: *Deleted

## 2013-03-27 DIAGNOSIS — Y9367 Activity, basketball: Secondary | ICD-10-CM | POA: Insufficient documentation

## 2013-03-27 DIAGNOSIS — S6390XA Sprain of unspecified part of unspecified wrist and hand, initial encounter: Secondary | ICD-10-CM | POA: Insufficient documentation

## 2013-03-27 DIAGNOSIS — X500XXA Overexertion from strenuous movement or load, initial encounter: Secondary | ICD-10-CM | POA: Insufficient documentation

## 2013-03-27 DIAGNOSIS — Z8679 Personal history of other diseases of the circulatory system: Secondary | ICD-10-CM | POA: Insufficient documentation

## 2013-03-27 DIAGNOSIS — F17200 Nicotine dependence, unspecified, uncomplicated: Secondary | ICD-10-CM | POA: Insufficient documentation

## 2013-03-27 DIAGNOSIS — Y9239 Other specified sports and athletic area as the place of occurrence of the external cause: Secondary | ICD-10-CM | POA: Insufficient documentation

## 2013-03-27 DIAGNOSIS — Y92838 Other recreation area as the place of occurrence of the external cause: Secondary | ICD-10-CM | POA: Insufficient documentation

## 2013-03-27 DIAGNOSIS — R296 Repeated falls: Secondary | ICD-10-CM | POA: Insufficient documentation

## 2013-03-27 DIAGNOSIS — S6392XA Sprain of unspecified part of left wrist and hand, initial encounter: Secondary | ICD-10-CM

## 2013-03-27 MED ORDER — HYDROCODONE-ACETAMINOPHEN 5-325 MG PO TABS: 1.0000 | ORAL_TABLET | ORAL | Status: DC | PRN

## 2013-03-27 MED ORDER — IBUPROFEN 800 MG PO TABS: 800.0000 mg | ORAL_TABLET | Freq: Three times a day (TID) | ORAL | Status: DC | PRN

## 2013-03-27 MED ORDER — IBUPROFEN 400 MG PO TABS
800.0000 mg | ORAL_TABLET | Freq: Once | ORAL | Status: AC
  Administered 2013-03-27: 800 mg via ORAL
  Filled 2013-03-27: qty 2

## 2013-03-27 NOTE — ED Notes (Signed)
Hyperextend lt. Fingers back when hitting the gym floor when playing basketball.  Upper x 4 fingers (distal), except the thumb is mildly swollen. Can bend fingers. Cms intact.

## 2013-03-27 NOTE — ED Provider Notes (Signed)
CSN: [id number]     Arrival date & time 03/27/13  2026 History  This chart was scribed for non-physician practitioner Trixie Dredge, PA-C, working with Gerhard Munch, MD, by Yevette Edwards, ED Scribe. This patient was seen in room TR05C/TR05C and the patient's care was started at 9:16 PM.   First MD Initiated Contact with Patient 03/27/13 2052     Chief Complaint  Patient presents with  . Hand Pain    The history is provided by the patient. No language interpreter was used.   HPI Comments: Adam Snow is a 37 y.o. male who presents to the Emergency Department complaining of sudden-onset pain to the four fingers of his left hand which began approximately five hours ago. The pt reports he was playing basketball, and he believes he hyperextended his hand when he caught himself with his left hand to keep from falling upon the gym floor. He rates the pain is a 8/10, and he describes it as "sharp" and "throbbing".  He reports he is experiencing paresthesia to his fingers. The pt denies head impact, LOC, pain to his left thumb, left elbow, left arm, left shoulder, neck, chest, abdomen or back.   Past Medical History  Diagnosis Date  . Migraine    Past Surgical History  Procedure Laterality Date  . Foot surgery      bilateral  . Patella fracture surgery      right   No family history on file. History  Substance Use Topics  . Smoking status: Current Every Day Smoker -- 0.50 packs/day    Types: Cigarettes  . Smokeless tobacco: Not on file  . Alcohol Use: No    Review of Systems  HENT: Negative for neck pain.   Cardiovascular: Negative for chest pain.  Gastrointestinal: Negative for abdominal pain.  Musculoskeletal: Positive for arthralgias (Fingers of left hand). Negative for back pain.  Neurological: Negative for syncope.    Allergies  Other  Home Medications   Current Outpatient Rx  Name  Route  Sig  Dispense  Refill  . Carboxymethylcellul-Glycerin (OPTIVE) 0.5-0.9  % SOLN   Ophthalmic   Apply 1 drop to eye at bedtime.         . carboxymethylcellulose (REFRESH TEARS) 0.5 % SOLN   Ophthalmic   Apply 1 drop to eye 3 (three) times daily as needed (for drye eye relief).         Marland Kitchen ibuprofen (ADVIL,MOTRIN) 200 MG tablet   Oral   Take 400 mg by mouth every 6 (six) hours as needed for pain.          Triage Vitals: BP 115/72  Pulse 71  Temp(Src) 98.2 F (36.8 C) (Oral)  Resp 14  SpO2 96%  Physical Exam  Nursing note and vitals reviewed. Constitutional: He appears well-developed and well-nourished. No distress.  HENT:  Head: Normocephalic and atraumatic.  Neck: Neck supple.  Pulmonary/Chest: Effort normal.  Musculoskeletal:       Left elbow: Normal.       Left wrist: Normal. He exhibits normal range of motion, no tenderness, no swelling, no crepitus and no deformity.       Left forearm: Normal.       Left hand: He exhibits decreased range of motion and tenderness. He exhibits normal capillary refill, no deformity and no laceration. Normal sensation noted.       Hands: Diffuse tenderness throughout hand from PIP to midcarpal on 2-5.  No crepitus or specific tenderness.  Capillary refill< 2  seconds.  Sensation intact.    Neurological: He is alert.  Skin: He is not diaphoretic.    ED Course   DIAGNOSTIC STUDIES: Oxygen Saturation is 96% on room air, normal by my interpretation.    COORDINATION OF CARE:  9:18 PM-Discussed treatment plan with patient, and the patient agreed to the plan. The pt declined pain medication.   10:15 PM- Rechecked pt. Informed him of his imaging results. Encouraged pt to use NSAIDs and ice to minimize swelling. The pt requested pain medication, stating the pain is now a 9/10. He reports pain to both the posterior and anterior aspects of his fingers.   Procedures (including critical care time)  Labs Reviewed - No data to display Dg Hand Complete Left  03/27/2013   *RADIOLOGY REPORT*  Clinical Data: Left hand  injury and pain.  LEFT HAND - COMPLETE 3+ VIEW  Comparison: None  Findings: No evidence of acute fracture, subluxation or dislocation identified.  No radio-opaque foreign bodies are present.  No focal bony lesions are noted.  The joint spaces are unremarkable.  IMPRESSION: No evidence of acute bony abnormality.   Original Report Authenticated By: Harmon Pier, M.D.   1. Hand sprain, left, initial encounter     MDM  Pt with fall onto left outstreatched hand, hyperextending all fingers.  Neurovascularly intact.  Pt able to move all joints but does not make a fist secondary to pain.  Xrays are negative for fracture.  Placed in ACE wrap, d/c home with RICE instructions, #8 norco.  PCP follow up.  Discussed all results with patient.  Pt given return precautions.  Pt verbalizes understanding and agrees with plan.     I personally performed the services described in this documentation, which was scribed in my presence. The recorded information has been reviewed and is accurate.   Trixie Dredge, PA-C 03/27/13 2239

## 2013-03-27 NOTE — ED Provider Notes (Signed)
  Medical screening examination/treatment/procedure(s) were performed by non-physician practitioner and as supervising physician I was immediately available for consultation/collaboration.    Gerhard Munch, MD 03/27/13 [phone]

## 2015-04-11 ENCOUNTER — Ambulatory Visit: Payer: Medicaid - Out of State | Admitting: Family Medicine

## 2015-05-02 ENCOUNTER — Ambulatory Visit: Payer: Medicaid - Out of State | Admitting: Family Medicine

## 2015-06-08 ENCOUNTER — Encounter: Payer: Self-pay | Admitting: Family Medicine

## 2015-06-08 ENCOUNTER — Ambulatory Visit (INDEPENDENT_AMBULATORY_CARE_PROVIDER_SITE_OTHER): Payer: Medicaid Other | Admitting: Family Medicine

## 2015-06-08 VITALS — BP 129/88 | HR 75 | Temp 98.2°F | Ht 72.0 in | Wt 235.0 lb

## 2015-06-08 DIAGNOSIS — Z0000 Encounter for general adult medical examination without abnormal findings: Secondary | ICD-10-CM | POA: Diagnosis not present

## 2015-06-08 LAB — COMPLETE METABOLIC PANEL WITH GFR
ALT: 50 U/L — AB (ref 9–46)
AST: 31 U/L (ref 10–40)
Albumin: 4.8 g/dL (ref 3.6–5.1)
Alkaline Phosphatase: 103 U/L (ref 40–115)
BILIRUBIN TOTAL: 0.5 mg/dL (ref 0.2–1.2)
BUN: 23 mg/dL (ref 7–25)
CALCIUM: 10.5 mg/dL — AB (ref 8.6–10.3)
CHLORIDE: 104 mmol/L (ref 98–110)
CO2: 21 mmol/L (ref 20–31)
CREATININE: 1.29 mg/dL (ref 0.60–1.35)
GFR, EST AFRICAN AMERICAN: 80 mL/min (ref >=60)
GFR, Est Non African American: 69 mL/min (ref >=60)
Glucose, Bld: 100 mg/dL — ABNORMAL HIGH (ref 65–99)
Potassium: 4.1 mmol/L (ref 3.5–5.3)
Sodium: 137 mmol/L (ref 135–146)
Total Protein: 8 g/dL (ref 6.1–8.1)

## 2015-06-08 LAB — CBC WITH DIFFERENTIAL/PLATELET
BASOS ABS: 0 10*3/uL (ref 0.0–0.1)
Basophils Relative: 0 % (ref 0–1)
EOS ABS: 0.1 10*3/uL (ref 0.0–0.7)
EOS PCT: 2 % (ref 0–5)
HCT: 43 % (ref 39.0–52.0)
Hemoglobin: 14.8 g/dL (ref 13.0–17.0)
LYMPHS PCT: 45 % (ref 12–46)
Lymphs Abs: 2.5 10*3/uL (ref 0.7–4.0)
MCH: 27.7 pg (ref 26.0–34.0)
MCHC: 34.4 g/dL (ref 30.0–36.0)
MCV: 80.4 fL (ref 78.0–100.0)
MPV: 10.2 fL (ref 8.6–12.4)
Monocytes Absolute: 0.5 10*3/uL (ref 0.1–1.0)
Monocytes Relative: 9 % (ref 3–12)
Neutro Abs: 2.5 10*3/uL (ref 1.7–7.7)
Neutrophils Relative %: 44 % (ref 43–77)
PLATELETS: 191 10*3/uL (ref 150–400)
RBC: 5.35 MIL/uL (ref 4.22–5.81)
RDW: 14.4 % (ref 11.5–15.5)
WBC: 5.6 10*3/uL (ref 4.0–10.5)

## 2015-06-08 LAB — LIPID PANEL
CHOLESTEROL: 226 mg/dL — AB (ref 125–200)
HDL: 36 mg/dL — AB (ref >=40)
LDL Cholesterol: 154 mg/dL — ABNORMAL HIGH (ref <130)
TRIGLYCERIDES: 178 mg/dL — AB (ref <150)
Total CHOL/HDL Ratio: 6.3 Ratio — ABNORMAL HIGH (ref <=5.0)
VLDL: 36 mg/dL — ABNORMAL HIGH (ref <30)

## 2015-06-08 LAB — TSH: TSH: 1.168 u[IU]/mL (ref 0.350–4.500)

## 2015-06-08 NOTE — Progress Notes (Signed)
Patient ID: Adam Snow, male   DOB: July 20, 1976, 39 y.o.   MRN: [id number]   Adam Snow, is a 39 y.o. male  [id number]  [id number]  DOB - May 31, 1976  CC:  Chief Complaint  Patient presents with  . Establish Care    wants 39 yo physical, no current complaints.   Denies flu shot.       HPI: Adam Snow is a 39 y.o. male here to establish care. He denies any current chronic health probems. He has a history of surgery on his feet for hammer toes and surgery for a pilonidal cyst.  He takes no medications on a regular basis except for the ibuprofen for his headaches. He does smokes 1/2 pack of cigarettes a day and is trying to quit. He denies alcohol or illicit drug use. He does not follow a health diet and does not exercise regularly. 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   Allergies  Allergen Reactions  . Other Hives and Nausea And Vomiting    Squash and okra:    Past Medical History  Diagnosis Date  . Migraine    Current Outpatient Prescriptions on File Prior to Visit  Medication Sig Dispense Refill  . ibuprofen (ADVIL,MOTRIN) 200 MG tablet Take 400 mg by mouth every 6 (six) hours as needed for pain.     No current facility-administered medications on file prior to visit.   Family History  Problem Relation Age of Onset  . Hyperlipidemia Mother   . Hypertension Mother   . Cancer Father    Social History   Social History  . Marital Status: Single    Spouse Name: N/A  . Number of Children: N/A  . Years of Education: N/A   Occupational History  . Not on file.   Social History Main Topics  . Smoking status: Current Every Day Smoker -- 0.50 packs/day    Types: Cigarettes  . Smokeless tobacco: Not on file  . Alcohol Use: No  . Drug Use: No  . Sexual Activity: Not on file   Other Topics Concern  . Not on file   Social History Narrative    Review of Systems: Constitutional: Negative for fever,  chills, appetite change, weight loss,  Fatigue. Has had some weight gain  Since he has been driving a truck. Skin: Negative for rashes or lesions of concern. HENT: Negative for ear pain, ear discharge.nose bleeds Eyes: Negative for pain, discharge, redness, itching and visual disturbance. Glasses for vision Neck: Negative for pain, stiffness Respiratory: Negative for cough, shortness of breath,   Cardiovascular: Negative for chest pain, palpitations. Occassional swelling of feet and lower legs when he drives 5-18 hours Gastrointestinal: Negative for abdominal pain, nausea, vomiting, diarrhea, constipations Genitourinary: Negative for dysuria, urgency, frequency, hematuria,  Musculoskeletal: Negative for back pain, joint pain, joint  swelling, and gait problem.Negative for weakness. Neurological: Negative for dizziness, tremors, seizures, syncope,   light-headedness, numbness. Has almost daily headaches which respond to ibuprofen   Hematological: Negative for easy bruising or bleeding Psychiatric/Behavioral: Negative for depression, anxiety, decreased concentration, confusion   Objective:   Filed Vitals:   06/08/15 1146  BP: 129/88  Pulse: 75  Temp: 98.2 F (36.8 C)    Physical Exam: Constitutional: Patient appears well-developed and well-nourished. No distress. HENT: Normocephalic, atraumatic, External right and left ear normal. Oropharynx is clear and moist.  Eyes: Conjunctivae and EOM are normal. PERRLA, no scleral icterus. Neck: Normal ROM. Neck supple.  No lymphadenopathy, No thyromegaly. CVS: RRR, S1/S2 +, no murmurs, no gallops, no rubs Pulmonary: Effort and breath sounds normal, no stridor, rhonchi, wheezes, rales.  Abdominal: Soft. Normoactive BS,, no distension, tenderness, rebound or guarding.  Musculoskeletal: Normal range of motion. No edema and no tenderness.  Neuro: Alert.Normal muscle tone coordination. Non-focal Skin: Skin is warm and dry. No rash noted. Not  diaphoretic. No erythema. No pallor. Psychiatric: Normal mood and affect. Behavior, judgment, thought content normal.  Lab Results  Component Value Date   HGB 15.6 08/22/2011   HCT 46.0 08/22/2011   Lab Results  Component Value Date   CREATININE 1.40* 08/22/2011   BUN 18 08/22/2011   NA 143 08/22/2011   K 4.1 08/22/2011   CL 107 08/22/2011    No results found for: HGBA1C Lipid Panel  No results found for: CHOL, TRIG, HDL, CHOLHDL, VLDL, LDLCALC     Assessment and plan:   1Health care maintenance  - COMPLETE METABOLIC PANEL WITH GFR - CBC with Differential - Lipid panel - TSH - Vitamin D 1,25 dihydroxy -Have provided a healthly life style handout and excouraged him to try to make some changes in his diet and to increase exercise.  2. Tobacco use -Have encouraged him to continue to work digiligently on smoking cessation   Return in about 1 year (around 06/07/2016).  The patient was given clear instructions to go to ER or return to medical center if symptoms don't improve, worsen or new problems develop. The patient verbalized understanding.    Micheline Chapman FNP  06/08/2015, 12:22 PM

## 2015-06-08 NOTE — Patient Instructions (Signed)

## 2015-06-13 LAB — VITAMIN D 1,25 DIHYDROXY
Vitamin D 1, 25 (OH)2 Total: 36 pg/mL (ref 18–72)
Vitamin D3 1, 25 (OH)2: 36 pg/mL

## 2015-06-15 ENCOUNTER — Telehealth: Payer: Self-pay

## 2015-06-15 ENCOUNTER — Other Ambulatory Visit: Payer: Self-pay | Admitting: Family Medicine

## 2015-06-15 MED ORDER — PRAVASTATIN SODIUM 40 MG PO TABS: 40.0000 mg | ORAL_TABLET | Freq: Every day | ORAL | Status: DC

## 2015-06-15 NOTE — Telephone Encounter (Signed)
-----   Message from Micheline Chapman, NP sent at 06/15/2015  8:17 AM EDT ----- Cholesterol high. Need to start on medication. Will send in.

## 2015-06-15 NOTE — Telephone Encounter (Signed)
Left message for patient to return call. Thanks!

## 2015-06-19 NOTE — Telephone Encounter (Signed)
CALLED AND SPOKE WITH PATIENT, ADVISED OF HIGH CHOLESTEROL AND TO TAKE MEDICATION AS PRESCRIBED. PATIENT HAD NO QUESTIONS AT THIS TIME. THANKS!

## 2015-12-14 ENCOUNTER — Emergency Department (HOSPITAL_COMMUNITY)
Admission: EM | Admit: 2015-12-14 | Discharge: 2015-12-14 | Disposition: A | Discharge status: Home / Self Care | Payer: Self-pay | Attending: Emergency Medicine | Admitting: Emergency Medicine

## 2015-12-14 ENCOUNTER — Emergency Department (HOSPITAL_COMMUNITY): Payer: Self-pay

## 2015-12-14 ENCOUNTER — Encounter (HOSPITAL_COMMUNITY): Payer: Self-pay | Admitting: Emergency Medicine

## 2015-12-14 DIAGNOSIS — M5412 Radiculopathy, cervical region: Secondary | ICD-10-CM | POA: Insufficient documentation

## 2015-12-14 DIAGNOSIS — Z79899 Other long term (current) drug therapy: Secondary | ICD-10-CM | POA: Insufficient documentation

## 2015-12-14 DIAGNOSIS — F17210 Nicotine dependence, cigarettes, uncomplicated: Secondary | ICD-10-CM | POA: Insufficient documentation

## 2015-12-14 DIAGNOSIS — R519 Headache, unspecified: Secondary | ICD-10-CM

## 2015-12-14 DIAGNOSIS — R51 Headache: Secondary | ICD-10-CM | POA: Insufficient documentation

## 2015-12-14 DIAGNOSIS — G8929 Other chronic pain: Secondary | ICD-10-CM

## 2015-12-14 DIAGNOSIS — Z8679 Personal history of other diseases of the circulatory system: Secondary | ICD-10-CM | POA: Insufficient documentation

## 2015-12-14 MED ORDER — CARISOPRODOL 350 MG PO TABS: 350.0000 mg | ORAL_TABLET | Freq: Three times a day (TID) | ORAL | Status: DC

## 2015-12-14 MED ORDER — SUMATRIPTAN SUCCINATE 100 MG PO TABS: 100.0000 mg | ORAL_TABLET | Freq: Every day | ORAL | Status: DC | PRN

## 2015-12-14 MED ORDER — PREDNISONE 10 MG (21) PO TBPK: ORAL_TABLET | ORAL | Status: DC

## 2015-12-14 NOTE — ED Provider Notes (Signed)
CSN: [id number]     Arrival date & time 12/14/15  [id number] History   First MD Initiated Contact with Patient 12/14/15 0920     Chief Complaint  Patient presents with  . Migraine  . Shoulder Pain   HPI Pt has had pain in his left neck going down to his left shoulder.  Started 5 weeks ago.  Tried otc meds, biofreeze without relief.  Sleeping in certain positions makes it worse. He decided today that needed to finally get it checked out and get to the bottom of it.  He has not made any appointments with his PCP.  He denis numbness in the left hand but has noticed some in the right hand.  Also has history of migraines.  Gets worse the last few months.  They are occuring daily.  Headache located both temples.  No trouble with balance speech or coordination. Past Medical History  Diagnosis Date  . Migraine    Past Surgical History  Procedure Laterality Date  . Foot surgery      bilateral  . Patella fracture surgery      right   Family History  Problem Relation Age of Onset  . Hyperlipidemia Mother   . Hypertension Mother   . Cancer Father    Social History  Substance Use Topics  . Smoking status: Current Every Day Smoker -- 0.50 packs/day    Types: Cigarettes  . Smokeless tobacco: None  . Alcohol Use: No    Review of Systems  All other systems reviewed and are negative.     Allergies  Other  Home Medications   Prior to Admission medications   Medication Sig Start Date End Date Taking? Authorizing Provider  ibuprofen (ADVIL,MOTRIN) 200 MG tablet Take 400 mg by mouth every 6 (six) hours as needed for pain.   Yes Historical Provider, MD  carisoprodol (SOMA) 350 MG tablet Take 1 tablet (350 mg total) by mouth 3 (three) times daily. 12/14/15   Dorie Rank, MD  pravastatin (PRAVACHOL) 40 MG tablet Take 1 tablet (40 mg total) by mouth daily. 06/15/15   Micheline Chapman, NP  predniSONE (STERAPRED UNI-PAK 21 TAB) 10 MG (21) TBPK tablet Take 6 tabs by mouth daily  for 2 days, then 5  tabs for 2 days, then 4 tabs for 2 days, then 3 tabs for 2 days, 2 tabs for 2 days, then 1 tab by mouth daily for 2 days 12/14/15   Dorie Rank, MD  SUMAtriptan (IMITREX) 100 MG tablet Take 1 tablet (100 mg total) by mouth daily as needed for migraine. May repeat in 2 hours if headache persists or recurs.  (2 tabs total per 24 hours) 12/14/15   Dorie Rank, MD   BP 119/82 mmHg  Pulse 57  Temp(Src) 97.8 F (36.6 C) (Oral)  Resp 17  Ht 6' (1.829 m)  Wt 117.935 kg  BMI 35.25 kg/m2  SpO2 98% Physical Exam  Constitutional: He appears well-developed and well-nourished. No distress.  HENT:  Head: Normocephalic and atraumatic.  Right Ear: External ear normal.  Left Ear: External ear normal.  Eyes: Conjunctivae are normal. Right eye exhibits no discharge. Left eye exhibits no discharge. No scleral icterus.  Neck: Neck supple. No tracheal deviation present.  Cardiovascular: Normal rate.   Pulmonary/Chest: Effort normal. No stridor. No respiratory distress.  Musculoskeletal: He exhibits no edema.       Cervical back: He exhibits tenderness. He exhibits normal range of motion, no bony tenderness, no swelling and no  edema.  Neurological: He is alert. He has normal strength. He displays no atrophy and no tremor. No cranial nerve deficit (no gross deficits) or sensory deficit. He exhibits normal muscle tone. Coordination normal. GCS eye subscore is 4. GCS verbal subscore is 5. GCS motor subscore is 6.  5/5 strength bilateral ue and le  Skin: Skin is warm and dry. No rash noted.  Psychiatric: He has a normal mood and affect.  Nursing note and vitals reviewed.   ED Course  Procedures (including critical care time) Labs Review Labs Reviewed - No data to display  Imaging Review Dg Cervical Spine Complete  12/14/2015  CLINICAL DATA:  Neck and left shoulder pain for 5 weeks without known injury. EXAM: CERVICAL SPINE - COMPLETE 4+ VIEW COMPARISON:  CT scan of August 22, 2011. FINDINGS: There is no  evidence of fracture or spondylolisthesis. Minimal disc space narrowing is noted at C5-6 suggesting degenerative disc disease. Remaining disc spaces appear intact. No significant neural foraminal stenosis is noted. Posterior facet joints appear normal. IMPRESSION: Minimal degenerative disc disease is noted at C5-6. No acute abnormality seen in cervical spine. Electronically Signed   By: Marijo Conception, M.D.   On: 12/14/2015 10:02   I have personally reviewed and evaluated these images and lab results as part of my medical decision-making.    MDM   Final diagnoses:  Cervical radiculopathy  Chronic nonintractable headache, unspecified headache type    Patient's symptoms are suggestive of a cervical radiculopathy.  X-rays do not show any acute bone abnormalities however there are some degenerative disc changes. Patient's symptoms are moderate. I will start him on a course of steroids and some laxatives. I recommend he follow up with the primary care doctor for further treatment and evaluation.  he also has history of chronic headaches. These do sound migraine-related. I doubt tumor, aneurysm or other emergency source for his headache. I will give him a prescription for Imitrex.    Dorie Rank, MD 12/14/15 1053

## 2015-12-14 NOTE — ED Notes (Addendum)
Patient states migraines since "February and just havent stopped".  Patient states L shoulder pain x 1 month.   Patient states uses ibuprofen for pain with no relief.   Patient states some cramping and tingling in his R hand when his shoulder hurts.   Patient states hasn't tried anything for pain x 1 week.

## 2015-12-14 NOTE — ED Notes (Signed)
Doctor at bedside.

## 2015-12-14 NOTE — Discharge Instructions (Signed)
Cervical Radiculopathy Cervical radiculopathy happens when a nerve in the neck (cervical nerve) is pinched or bruised. This condition can develop because of an injury or as part of the normal aging process. Pressure on the cervical nerves can cause pain or numbness that runs from the neck all the way down into the arm and fingers. Usually, this condition gets better with rest. Treatment may be needed if the condition does not improve.  CAUSES This condition may be caused by:  Injury.  Slipped (herniated) disk.  Muscle tightness in the neck because of overuse.  Arthritis.  Breakdown or degeneration in the bones and joints of the spine (spondylosis) due to aging.  Bone spurs that may develop near the cervical nerves. SYMPTOMS Symptoms of this condition include:  Pain that runs from the neck to the arm and hand. The pain can be severe or irritating. It may be worse when the neck is moved.  Numbness or weakness in the affected arm and hand. DIAGNOSIS This condition may be diagnosed based on symptoms, medical history, and a physical exam. You may also have tests, including:  X-rays.  CT scan.  MRI.  Electromyogram (EMG).  Nerve conduction tests. TREATMENT In many cases, treatment is not needed for this condition. With rest, the condition usually gets better over time. If treatment is needed, options may include:  Wearing a soft neck collar for short periods of time.  Physical therapy to strengthen your neck muscles.  Medicines, such as NSAIDs, oral corticosteroids, or spinal injections.  Surgery. This may be needed if other treatments do not help. Various types of surgery may be done depending on the cause of your problems. HOME CARE INSTRUCTIONS Managing Pain  Take over-the-counter and prescription medicines only as told by your health care provider.  If directed, apply ice to the affected area.  Put ice in a plastic bag.  Place a towel between your skin and the  bag.  Leave the ice on for 20 minutes, 2-3 times per day.  If ice does not help, you can try using heat. Take a warm shower or warm bath, or use a heat pack as told by your health care provider.  Try a gentle neck and shoulder massage to help relieve symptoms. Activity  Rest as needed. Follow instructions from your health care provider about any restrictions on activities.  Do stretching and strengthening exercises as told by your health care provider or physical therapist. General Instructions  If you were given a soft collar, wear it as told by your health care provider.  Use a flat pillow when you sleep.  Keep all follow-up visits as told by your health care provider. This is important. SEEK MEDICAL CARE IF:  Your condition does not improve with treatment. SEEK IMMEDIATE MEDICAL CARE IF:  Your pain gets much worse and cannot be controlled with medicines.  You have weakness or numbness in your hand, arm, face, or leg.  You have a high fever.  You have a stiff, rigid neck.  You lose control of your bowels or your bladder (have incontinence).  You have trouble with walking, balance, or speaking.   This information is not intended to replace advice given to you by your health care provider. Make sure you discuss any questions you have with your health care provider.   Document Released: 05/07/2001 Document Revised: 05/03/2015 Document Reviewed: 10/06/2014 Elsevier Interactive Patient Education 2016 [employer].  Migraine Headache A migraine headache is an intense, throbbing pain on one or  both sides of your head. A migraine can last for 30 minutes to several hours. CAUSES  The exact cause of a migraine headache is not always known. However, a migraine may be caused when nerves in the brain become irritated and release chemicals that cause inflammation. This causes pain. Certain things may also trigger migraines, such  as:  Alcohol.  Smoking.  Stress.  Menstruation.  Aged cheeses.  Foods or drinks that contain nitrates, glutamate, aspartame, or tyramine.  Lack of sleep.  Chocolate.  Caffeine.  Hunger.  Physical exertion.  Fatigue.  Medicines used to treat chest pain (nitroglycerine), birth control pills, estrogen, and some blood pressure medicines. SIGNS AND SYMPTOMS  Pain on one or both sides of your head.  Pulsating or throbbing pain.  Severe pain that prevents daily activities.  Pain that is aggravated by any physical activity.  Nausea, vomiting, or both.  Dizziness.  Pain with exposure to bright lights, loud noises, or activity.  General sensitivity to bright lights, loud noises, or smells. Before you get a migraine, you may get warning signs that a migraine is coming (aura). An aura may include:  Seeing flashing lights.  Seeing bright spots, halos, or zigzag lines.  Having tunnel vision or blurred vision.  Having feelings of numbness or tingling.  Having trouble talking.  Having muscle weakness. DIAGNOSIS  A migraine headache is often diagnosed based on:  Symptoms.  Physical exam.  A CT scan or MRI of your head. These imaging tests cannot diagnose migraines, but they can help rule out other causes of headaches. TREATMENT Medicines may be given for pain and nausea. Medicines can also be given to help prevent recurrent migraines.  HOME CARE INSTRUCTIONS  Only take over-the-counter or prescription medicines for pain or discomfort as directed by your health care provider. The use of long-term narcotics is not recommended.  Lie down in a dark, quiet room when you have a migraine.  Keep a journal to find out what may trigger your migraine headaches. For example, write down:  What you eat and drink.  How much sleep you get.  Any change to your diet or medicines.  Limit alcohol consumption.  Quit smoking if you smoke.  Get 7-9 hours of sleep, or as  recommended by your health care provider.  Limit stress.  Keep lights dim if bright lights bother you and make your migraines worse. SEEK IMMEDIATE MEDICAL CARE IF:   Your migraine becomes severe.  You have a fever.  You have a stiff neck.  You have vision loss.  You have muscular weakness or loss of muscle control.  You start losing your balance or have trouble walking.  You feel faint or pass out.  You have severe symptoms that are different from your first symptoms. MAKE SURE YOU:   Understand these instructions.  Will watch your condition.  Will get help right away if you are not doing well or get worse.   This information is not intended to replace advice given to you by your health care provider. Make sure you discuss any questions you have with your health care provider.   Document Released: 08/12/2005 Document Revised: 09/02/2014 Document Reviewed: 04/19/2013 Elsevier Interactive Patient Education [employer].

## 2016-06-07 ENCOUNTER — Ambulatory Visit: Payer: Medicaid Other | Admitting: Family Medicine

## 2017-07-23 ENCOUNTER — Encounter (HOSPITAL_COMMUNITY): Payer: Self-pay

## 2017-07-23 ENCOUNTER — Other Ambulatory Visit: Payer: Self-pay

## 2017-07-23 ENCOUNTER — Emergency Department (HOSPITAL_COMMUNITY)
Admission: EM | Admit: 2017-07-23 | Discharge: 2017-07-23 | Disposition: A | Discharge status: Home / Self Care | Payer: BLUE CROSS/BLUE SHIELD | Attending: Emergency Medicine | Admitting: Emergency Medicine

## 2017-07-23 DIAGNOSIS — X501XXA Overexertion from prolonged static or awkward postures, initial encounter: Secondary | ICD-10-CM | POA: Diagnosis not present

## 2017-07-23 DIAGNOSIS — M79606 Pain in leg, unspecified: Secondary | ICD-10-CM | POA: Insufficient documentation

## 2017-07-23 DIAGNOSIS — M5417 Radiculopathy, lumbosacral region: Secondary | ICD-10-CM | POA: Diagnosis not present

## 2017-07-23 DIAGNOSIS — Z79899 Other long term (current) drug therapy: Secondary | ICD-10-CM | POA: Diagnosis not present

## 2017-07-23 DIAGNOSIS — F17210 Nicotine dependence, cigarettes, uncomplicated: Secondary | ICD-10-CM | POA: Diagnosis not present

## 2017-07-23 DIAGNOSIS — M545 Low back pain: Secondary | ICD-10-CM | POA: Diagnosis present

## 2017-07-23 MED ORDER — TRAMADOL HCL 50 MG PO TABS: 50.0000 mg | ORAL_TABLET | Freq: Four times a day (QID) | ORAL | 0 refills | Status: DC | PRN

## 2017-07-23 MED ORDER — METHOCARBAMOL 500 MG PO TABS: 1000.0000 mg | ORAL_TABLET | Freq: Four times a day (QID) | ORAL | 0 refills | Status: DC

## 2017-07-23 MED ORDER — HYDROCODONE-ACETAMINOPHEN 5-325 MG PO TABS
2.0000 | ORAL_TABLET | Freq: Once | ORAL | Status: AC
  Administered 2017-07-23: 2 via ORAL
  Filled 2017-07-23: qty 2

## 2017-07-23 NOTE — ED Triage Notes (Signed)
Patient complains of years of lower back pain with radiation down buttocks and right leg. States that he does a lot of standing and lifting at work

## 2017-07-23 NOTE — ED Provider Notes (Signed)
[facility] EMERGENCY DEPARTMENT Provider Note   CSN: [id number] Arrival date & time: 07/23/17  0935     History   Chief Complaint No chief complaint on file.   HPI Adam Snow is a 41 y.o. male.  Patient with history of sciatica presents with acute worsening of lower back and leg pain.  Symptoms became worse approximately 1-2 weeks ago when he was struck by a falling box.  Patient does a lot of driving and some lifting with his job.  He does warm soaks at home to help with the pain.  Typically this improves his symptoms but pain is more persistent now.  Denies other treatments.  He denies previous imaging of his back.  Patient denies warning symptoms of back pain including: fecal incontinence, urinary retention or overflow incontinence, night sweats, waking from sleep with back pain, unexplained fevers or weight loss, h/o cancer, IVDU, recent trauma.         Past Medical History:  Diagnosis Date  . Migraine     There are no active problems to display for this patient.   Past Surgical History:  Procedure Laterality Date  . FOOT SURGERY     bilateral  . PATELLA FRACTURE SURGERY     right       Home Medications    Prior to Admission medications   Medication Sig Start Date End Date Taking? Authorizing Provider  ibuprofen (ADVIL,MOTRIN) 200 MG tablet Take 400 mg by mouth every 6 (six) hours as needed for pain.    [provider]  methocarbamol (ROBAXIN) 500 MG tablet Take 2 tablets (1,000 mg total) by mouth 4 (four) times daily. 07/23/17   Carlisle Cater, PA-C  pravastatin (PRAVACHOL) 40 MG tablet Take 1 tablet (40 mg total) by mouth daily. 06/15/15   Micheline Chapman, NP  traMADol (ULTRAM) 50 MG tablet Take 1 tablet (50 mg total) by mouth every 6 (six) hours as needed. 07/23/17   Carlisle Cater, PA-C    Family History Family History  Problem Relation Age of Onset  . Hyperlipidemia Mother   . Hypertension Mother   . Cancer  Father     Social History Social History   Tobacco Use  . Smoking status: Current Every Day Smoker    Packs/day: 0.50    Types: Cigarettes  . Smokeless tobacco: Never Used  Substance Use Topics  . Alcohol use: No  . Drug use: No     Allergies   Other   Review of Systems Review of Systems  Constitutional: Negative for fever and unexpected weight change.  Gastrointestinal: Negative for constipation.       Neg for fecal incontinence  Genitourinary: Negative for difficulty urinating, flank pain and hematuria.       Negative for urinary incontinence or retention  Musculoskeletal: Positive for back pain.  Neurological: Negative for weakness and numbness.       Negative for saddle paresthesias      Physical Exam Updated Vital Signs BP 137/86   Pulse 63   Temp 98.3 F (36.8 C) (Oral)   Resp 18   Ht 6' (1.829 m)   Wt 90.7 kg (200 lb)   SpO2 100%   BMI 27.12 kg/m   Physical Exam  Constitutional: He appears well-developed and well-nourished.  HENT:  Head: Normocephalic and atraumatic.  Eyes: Conjunctivae are normal.  Neck: Normal range of motion.  Abdominal: Soft. There is no tenderness. There is no CVA tenderness.  Musculoskeletal: Normal range of  motion.       Cervical back: He exhibits normal range of motion, no tenderness and no bony tenderness.       Thoracic back: He exhibits normal range of motion, no tenderness and no bony tenderness.       Lumbar back: He exhibits tenderness. He exhibits normal range of motion and no bony tenderness.  No step-off noted with palpation of spine.   Neurological: He is alert. He has normal reflexes. No sensory deficit. He exhibits normal muscle tone.  5/5 strength in entire lower extremities bilaterally. No sensation deficit.   Skin: Skin is warm and dry.  Psychiatric: He has a normal mood and affect.  Nursing note and vitals reviewed.    ED Treatments / Results  Labs (all labs ordered are listed, but only abnormal  results are displayed) Labs Reviewed - No data to display  EKG  EKG Interpretation None       Radiology No results found.  Procedures Procedures (including critical care time)  Medications Ordered in ED Medications  HYDROcodone-acetaminophen (NORCO/VICODIN) 5-325 MG per tablet 2 tablet (2 tablets Oral Given 07/23/17 1202)     Initial Impression / Assessment and Plan / ED Course  I have reviewed the triage vital signs and the nursing notes.  Pertinent labs & imaging results that were available during my care of the patient were reviewed by me and considered in my medical decision making (see chart for details).     12:14 PM Patient seen and examined. Medications ordered.   Vital signs reviewed and are as follows: Vitals:   07/23/17 0949  BP: 137/86  Pulse: 63  Resp: 18  Temp: 98.3 F (36.8 C)  SpO2: 100%    No red flag s/s of low back pain. Patient was counseled on back pain precautions and told to do activity as tolerated but do not lift, push, or pull heavy objects more than 10 pounds for the next week.  Patient counseled to use ice or heat on back for no longer than 15 minutes every hour.   Patient prescribed muscle relaxer and counseled on proper use of muscle relaxant medication.    Patient prescribed narcotic pain medicine and counseled on proper use of narcotic pain medications. Counseled not to combine this medication with others containing tylenol.   Urged patient not to drink alcohol, drive, or perform any other activities that requires focus while taking either of these medications.  Patient urged to follow-up with PCP if pain does not improve with treatment and rest or if pain becomes recurrent. Urged to return with worsening severe pain, loss of bowel or bladder control, trouble walking.   The patient verbalizes understanding and agrees with the plan.    Final Clinical Impressions(s) / ED Diagnoses   Final diagnoses:  Lumbosacral radiculopathy    Patient with back pain with his typical radicular features. No neurological deficits. Patient is ambulatory. No warning symptoms of back pain including: fecal incontinence, urinary retention or overflow incontinence, night sweats, waking from sleep with back pain, unexplained fevers or weight loss, h/o cancer, IVDU, recent trauma. No concern for cauda equina, epidural abscess, or other serious cause of back pain. Conservative measures such as rest, ice/heat and pain medicine indicated with PCP/neurosurgery follow-up if no improvement with conservative management.     ED Discharge Orders        Ordered    traMADol (ULTRAM) 50 MG tablet  Every 6 hours PRN     07/23/17 1155  methocarbamol (ROBAXIN) 500 MG tablet  4 times daily     07/23/17 1155       Carlisle Cater, [place] 07/23/17 1214    Tegeler, Gwenyth Allegra, MD 07/23/17 2052

## 2017-07-23 NOTE — Discharge Instructions (Signed)
Please read and follow all provided instructions.  Your diagnoses today include:  1. Lumbosacral radiculopathy    Tests performed today include:  Vital signs - see below for your results today  Medications prescribed:   Robaxin (methocarbamol) - muscle relaxer medication  DO NOT drive or perform any activities that require you to be awake and alert because this medicine can make you drowsy.    Tramadol - narcotic-like pain medication  DO NOT drive or perform any activities that require you to be awake and alert because this medicine can make you drowsy.   Take any prescribed medications only as directed.  Home care instructions:   Follow any educational materials contained in this packet  Please rest, use ice or heat on your back for the next several days  Do not lift, push, pull anything more than 10 pounds for the next week  Follow-up instructions: Please follow-up with your primary care provider or the neurosurgeon in the next 1 week for further evaluation of your symptoms.   Return instructions:  SEEK IMMEDIATE MEDICAL ATTENTION IF YOU HAVE:  New numbness, tingling, weakness, or problem with the use of your arms or legs  Severe back pain not relieved with medications  Loss control of your bowels or bladder  Increasing pain in any areas of the body (such as chest or abdominal pain)  Shortness of breath, dizziness, or fainting.   Worsening nausea (feeling sick to your stomach), vomiting, fever, or sweats  Any other emergent concerns regarding your health   Additional Information:  Your vital signs today were: BP 137/86    Pulse 63    Temp 98.3 F (36.8 C) (Oral)    Resp 18    Ht 6' (1.829 m)    Wt 90.7 kg (200 lb)    SpO2 100%    BMI 27.12 kg/m  If your blood pressure (BP) was elevated above 135/85 this visit, please have this repeated by your doctor within one month. --------------

## 2017-08-26 HISTORY — PX: LUMBAR FUSION: SHX111

## 2017-10-07 ENCOUNTER — Other Ambulatory Visit: Payer: Self-pay

## 2017-10-07 ENCOUNTER — Encounter: Payer: Self-pay | Admitting: Family Medicine

## 2017-10-07 ENCOUNTER — Ambulatory Visit (INDEPENDENT_AMBULATORY_CARE_PROVIDER_SITE_OTHER): Payer: BLUE CROSS/BLUE SHIELD | Admitting: Family Medicine

## 2017-10-07 VITALS — BP 128/80 | HR 67 | Temp 98.5°F | Ht 72.0 in | Wt 226.0 lb

## 2017-10-07 DIAGNOSIS — Z9889 Other specified postprocedural states: Secondary | ICD-10-CM

## 2017-10-07 DIAGNOSIS — Z7689 Persons encountering health services in other specified circumstances: Secondary | ICD-10-CM

## 2017-10-07 NOTE — Progress Notes (Signed)
   Subjective:    Patient ID: Adam Snow, male    DOB: 1976-03-26, 42 y.o.   MRN: [id number]   CC: establishing care  HPI:  Health Maintenance: Concerns today: none Colonoscopy: not due, no family hx of colon cancer Sexually active: with wife only Exercise: Patient going to physical therapy currently and unable to exercise as usual , but likes to work outside and play sports often with his children Diet: regular diet with fruits and vegetables Advanced directives: given handout today to complete  Recent Surgery on L5: - Currently in PT with no concerns and reports he is doing well - Patient is going 3 times per week - He suffered injury at work and is much improved. He is using robaxin and gabapentin? For his nerve pain and back for now, he has stopped his oxy that was given. His pain is well managed and he is working hard at [employer].    Review of Systems  Constitutional: Negative for chills and fever.  HENT: Negative for congestion.   Eyes: Negative for redness.  Respiratory: Negative for cough.   Cardiovascular: Negative for chest pain.  Gastrointestinal: Negative for vomiting.  Genitourinary: Negative for dysuria.  Musculoskeletal: Positive for back pain.       Improved, recent surgery  Skin: Negative for rash.  Neurological: Negative for headaches.   Family History  Problem Relation Age of Onset  . Hyperlipidemia Mother   . Hypertension Mother   . Cancer Father   . Asthma Sister   . Alcohol abuse Brother   . Drug abuse Brother    PMHx: Recent surgery to L5 for injury at work, migraines  Social Hx: Patient smokes 0.5 ppd and is not interested in quitting. Offered help when is ready to try and quit. Denies alcohol use or drug use. Is a truck driver.  Objective:  BP 128/80   Pulse 67   Temp 98.5 F (36.9 C) (Oral)   Ht 6' (1.829 m)   Wt 226 lb (102.5 kg)   SpO2 97%   BMI 30.65 kg/m  Vitals and nursing note reviewed  General: NAD, pleasant Neck:  Supple, FROM Cardiac: RRR, normal heart sounds, no murmurs Respiratory: CTAB, normal effort Abdomen: soft, nontender, nondistended. Bowel sounds present Extremities: no edema or cyanosis. WWP. Skin: warm and dry, no rashes noted Neuro: alert and oriented, no focal deficits Psych: normal affect  Assessment & Plan:  Healthcare Maintenance: patient doing well with no concerns. Vitals are all wnl. No family history of colon cancer or heart disease. No screening indicated at this time given patient's age. Patient refused influenza vaccine.   Status post lumbar surgery Patient encouraged to continue with physical therapy and he reports his pain is well controlled. Using a nerve medication, possibly gabapentin and robaxin for his recent surgery. Has recently stopped his oxy for pain. Doing much better overall. Will continue to follow.   Martin[place] Danner Paulding, DO Family Medicine Resident PGY-1

## 2017-10-07 NOTE — Patient Instructions (Addendum)
Thank you for coming to see me today. It was a pleasure! Today we talked about:   Your health maintenance and performed a physical. Everything looks great and there are no labs that need to be done. Please continue working with physical therapy regarding your back.  Please follow-up with me in one year or as needed.  If you have any questions or concerns, please do not hesitate to call the office at [phone].  Take Care,   [place] Jawana Reagor, DO

## 2017-10-08 ENCOUNTER — Ambulatory Visit: Payer: BLUE CROSS/BLUE SHIELD | Admitting: Family Medicine

## 2017-10-08 ENCOUNTER — Encounter: Payer: Self-pay | Admitting: Family Medicine

## 2017-10-09 DIAGNOSIS — Z9889 Other specified postprocedural states: Secondary | ICD-10-CM | POA: Insufficient documentation

## 2017-10-09 NOTE — Assessment & Plan Note (Signed)
Patient encouraged to continue with physical therapy and he reports his pain is well controlled. Using a nerve medication, possibly gabapentin and robaxin for his recent surgery. Has recently stopped his oxy for pain. Doing much better overall. Will continue to follow.

## 2018-08-22 ENCOUNTER — Emergency Department (HOSPITAL_COMMUNITY)
Admission: EM | Admit: 2018-08-22 | Discharge: 2018-08-22 | Disposition: A | Discharge status: Home / Self Care | Payer: Self-pay | Attending: Emergency Medicine | Admitting: Emergency Medicine

## 2018-08-22 ENCOUNTER — Emergency Department (HOSPITAL_COMMUNITY): Payer: Self-pay

## 2018-08-22 DIAGNOSIS — Z79899 Other long term (current) drug therapy: Secondary | ICD-10-CM | POA: Insufficient documentation

## 2018-08-22 DIAGNOSIS — Y9301 Activity, walking, marching and hiking: Secondary | ICD-10-CM | POA: Insufficient documentation

## 2018-08-22 DIAGNOSIS — F17210 Nicotine dependence, cigarettes, uncomplicated: Secondary | ICD-10-CM | POA: Insufficient documentation

## 2018-08-22 DIAGNOSIS — Y929 Unspecified place or not applicable: Secondary | ICD-10-CM | POA: Insufficient documentation

## 2018-08-22 DIAGNOSIS — Y999 Unspecified external cause status: Secondary | ICD-10-CM | POA: Insufficient documentation

## 2018-08-22 DIAGNOSIS — S93401A Sprain of unspecified ligament of right ankle, initial encounter: Secondary | ICD-10-CM | POA: Insufficient documentation

## 2018-08-22 DIAGNOSIS — W1842XA Slipping, tripping and stumbling without falling due to stepping into hole or opening, initial encounter: Secondary | ICD-10-CM | POA: Insufficient documentation

## 2018-08-22 NOTE — Discharge Instructions (Signed)
Please read instructions below. Apply ice to your ankle for 20 minutes at a time. Elevate it as much as possible. Avoid weight bearing for the next week to allow proper healing. You can take ibuprofen every 6 hours as needed for pain and swelling. Schedule an appointment with the orthopedic specialist in 1 week for follow-up on your injury. Return to the ER for new or concerning symptoms.

## 2018-08-22 NOTE — ED Provider Notes (Signed)
[facility] EMERGENCY DEPARTMENT Provider Note   CSN: [id number] Arrival date & time: 08/22/18  2637     History   Chief Complaint Chief Complaint  Patient presents with  . Ankle Pain    HPI Adam Snow is a 42 y.o. male presenting to the emergency department plan of acute onset of right ankle pain after stepping in a hole yesterday evening and rolling it.  He states he had immediate pain to the lateral aspect of his ankle as well as the top of his foot that is worse with any weightbearing and ambulation.  Has associated swelling.  Applied heat, ice, and soaked his foot last night for symptoms.  Denies previous injury to his right foot or ankle.  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   Ankle Pain      Past Medical History:  Diagnosis Date  . Migraine     Patient Active Problem List   Diagnosis Date Noted  . Status post lumbar surgery 10/09/2017    Past Surgical History:  Procedure Laterality Date  . FOOT SURGERY     bilateral  . PATELLA FRACTURE SURGERY     right        Home Medications    Prior to Admission medications   Medication Sig Start Date End Date Taking? Authorizing Provider  gabapentin (NEURONTIN) 100 MG capsule Take 100 mg by mouth 3 (three) times daily.    [provider]  ibuprofen (ADVIL,MOTRIN) 200 MG tablet Take 400 mg by mouth every 6 (six) hours as needed for pain.    [provider]  methocarbamol (ROBAXIN) 500 MG tablet Take 2 tablets (1,000 mg total) by mouth 4 (four) times daily. 07/23/17   Carlisle Cater, PA-C    Family History Family History  Problem Relation Age of Onset  . Hyperlipidemia Mother   . Hypertension Mother   . Cancer Father   . Asthma Sister   . Alcohol abuse Brother   . Drug abuse Brother     Social History Social History   Tobacco Use  . Smoking status: Current Every Day Smoker    Packs/day: 0.50    Types:  Cigarettes  . Smokeless tobacco: Never Used  Substance Use Topics  . Alcohol use: No  . Drug use: No     Allergies   Other   Review of Systems Review of Systems  Musculoskeletal: Positive for arthralgias and joint swelling.  Skin: Positive for color change.     Physical Exam Updated Vital Signs BP 116/82 (BP Location: Left Arm)   Pulse (!) 57   Temp 98 F (36.7 C) (Oral)   Resp 18   SpO2 100%   Physical Exam Vitals signs and nursing note reviewed.  Constitutional:      General: He is not in acute distress.    Appearance: He is well-developed.  HENT:     Head: Normocephalic and atraumatic.  Eyes:     Conjunctiva/sclera: Conjunctivae normal.  Cardiovascular:     Rate and Rhythm: Normal rate.     Pulses: Normal pulses.  Pulmonary:     Effort: Pulmonary effort is normal.  Musculoskeletal:     Comments: Right ankle with swelling mostly over the lateral aspect.  There is associated tenderness just anterior to the lateral malleolus.  There is also some tenderness to the dorsum of the foot and distal to the lateral malleolus w some bruising.  Patient is able to range the ankle, however does have pain with inversion.  No obvious deformity.  Intact distal pulses and brisk cap refill.  Neurological:     Mental Status: He is alert.  Psychiatric:        Mood and Affect: Mood normal.        Behavior: Behavior normal.      ED Treatments / Results  Labs (all labs ordered are listed, but only abnormal results are displayed) Labs Reviewed - No data to display  EKG None  Radiology Dg Ankle Complete Right  Result Date: 08/22/2018 CLINICAL DATA:  60 year old who stepped in a hole while outside yesterday, sustaining a twisting injury to the RIGHT foot and RIGHT ankle. Pain and swelling localizes to the DORSAL hindfoot and into the LATERAL hindfoot and LATERAL ankle. Initial encounter. EXAM: RIGHT ANKLE - COMPLETE 3+ VIEW COMPARISON:  None. FINDINGS: No evidence of acute  fracture. Ankle mortise intact with anatomic alignment of the tibiotalar joint and a well-preserved joint space. Well-preserved bone mineral density. Small spur at a ligamentous attachment on the proximal aspect of the MEDIAL malleolus with associated overlying soft tissue swelling. No visible joint effusion or hemarthrosis. IMPRESSION: No acute osseous abnormality. Electronically Signed   By: Evangeline Dakin M.D.   On: 08/22/2018 11:55   Dg Foot Complete Right  Result Date: 08/22/2018 CLINICAL DATA:  47 year old who stepped in a hole while outside yesterday, sustaining a twisting injury to the RIGHT foot and RIGHT ankle. Pain and swelling localizes to the DORSAL hindfoot and into the LATERAL hindfoot and LATERAL ankle. Initial encounter. EXAM: RIGHT FOOT COMPLETE - 3+ VIEW COMPARISON:  None. FINDINGS: Soft tissue swelling diffusely involving the hindfoot. No evidence of acute fracture or dislocation. Remote fracture involving the first metatarsal with prior K-wire ORIF and normal healing. Well-preserved joint spaces. Well-preserved bone mineral density. 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 IMPRESSION: No acute osseous abnormality. Electronically Signed   By: Evangeline Dakin M.D.   On: 08/22/2018 11:57    Procedures Procedures (including critical care time)  Medications Ordered in ED Medications - No data to display   Initial Impression / Assessment and Plan / ED Course  I have reviewed the triage vital signs and the nursing notes.  Pertinent labs & imaging results that were available during my care of the patient were reviewed by me and considered in my medical decision making (see chart for details).     Patient with right lateral ankle pain after rolling it yesterday stepping in a hole outside.  Exam with swelling and tenderness to the lateral aspect.  Patient X-Ray negative for acute fracture or  dislocation.  Patient declined ibuprofen.  Will treat as suspected sprain with ASO brace and crutches.  RICE therapy.  Pt advised to follow up with orthopedics.  Patient will be dc home & is agreeable with above plan.  Discussed results, findings, treatment and follow up. Patient advised of return precautions. Patient verbalized understanding and agreed with plan.  Final Clinical Impressions(s) / ED Diagnoses   Final diagnoses:  Sprain of right ankle, unspecified ligament, initial encounter    ED Discharge Orders    None       Robinson, Martin[place] N, PA-C 08/22/18 1221    Blanchie Dessert, MD 08/23/18 1308

## 2018-08-22 NOTE — ED Triage Notes (Signed)
Pt endorses stepping in a hole yesterday and injuring right ankle. Pt has brace from home on right ankle. Limited ROM. PMS intact

## 2019-07-06 DIAGNOSIS — H5203 Hypermetropia, bilateral: Secondary | ICD-10-CM | POA: Diagnosis not present

## 2019-07-06 DIAGNOSIS — H52223 Regular astigmatism, bilateral: Secondary | ICD-10-CM | POA: Diagnosis not present

## 2019-07-06 DIAGNOSIS — H1013 Acute atopic conjunctivitis, bilateral: Secondary | ICD-10-CM | POA: Diagnosis not present

## 2019-07-06 DIAGNOSIS — H524 Presbyopia: Secondary | ICD-10-CM | POA: Diagnosis not present

## 2019-07-06 DIAGNOSIS — Z8669 Personal history of other diseases of the nervous system and sense organs: Secondary | ICD-10-CM | POA: Diagnosis not present

## 2019-07-13 DIAGNOSIS — H5022 Vertical strabismus, left eye: Secondary | ICD-10-CM | POA: Diagnosis not present

## 2019-07-13 DIAGNOSIS — H5021 Vertical strabismus, right eye: Secondary | ICD-10-CM | POA: Diagnosis not present

## 2020-02-22 ENCOUNTER — Ambulatory Visit (INDEPENDENT_AMBULATORY_CARE_PROVIDER_SITE_OTHER): Payer: Medicaid Other | Admitting: Family Medicine

## 2020-02-22 ENCOUNTER — Encounter: Payer: Self-pay | Admitting: Family Medicine

## 2020-02-22 ENCOUNTER — Other Ambulatory Visit: Payer: Self-pay

## 2020-02-22 VITALS — BP 110/70 | HR 84 | Ht 72.0 in | Wt 234.5 lb

## 2020-02-22 DIAGNOSIS — Z23 Encounter for immunization: Secondary | ICD-10-CM

## 2020-02-22 DIAGNOSIS — Z1211 Encounter for screening for malignant neoplasm of colon: Secondary | ICD-10-CM

## 2020-02-22 DIAGNOSIS — Z9889 Other specified postprocedural states: Secondary | ICD-10-CM

## 2020-02-22 DIAGNOSIS — Z0000 Encounter for general adult medical examination without abnormal findings: Secondary | ICD-10-CM | POA: Diagnosis not present

## 2020-02-22 MED ORDER — GABAPENTIN 100 MG PO CAPS: 100.0000 mg | ORAL_CAPSULE | Freq: Three times a day (TID) | ORAL | 3 refills | Status: AC

## 2020-02-22 MED ORDER — METHOCARBAMOL 500 MG PO TABS: 1000.0000 mg | ORAL_TABLET | Freq: Four times a day (QID) | ORAL | 0 refills | Status: DC

## 2020-02-22 NOTE — Patient Instructions (Addendum)
Thank you for coming to see me today. It was a pleasure! Today we talked about:   We will release your results on MyChart and call if anything is abnormal. Pleas elet [place] know if you would like help with smoking cessation. I have placed a referral or your colonoscopy and they will call to schedule.   Please follow-up with our clinic in 1 year or sooner as needed.  If you have any questions or concerns, please do not hesitate to call the office at [phone].  Take Care,   Adam Lilja Soland, DO   Health Maintenance, Male Adopting a healthy lifestyle and getting preventive care are important in promoting health and wellness. Ask your health care provider about:  The right schedule for you to have regular tests and exams.  Things you can do on your own to prevent diseases and keep yourself healthy. What should I know about diet, weight, and exercise? Eat a healthy diet   Eat a diet that includes plenty of vegetables, fruits, low-fat dairy products, and lean protein.  Do not eat a lot of foods that are high in solid fats, added sugars, or sodium. Maintain a healthy weight Body mass index (BMI) is a measurement that can be used to identify possible weight problems. It estimates body fat based on height and weight. Your health care provider can help determine your BMI and help you achieve or maintain a healthy weight. Get regular exercise Get regular exercise. This is one of the most important things you can do for your health. Most adults should:  Exercise for at least 150 minutes each week. The exercise should increase your heart rate and make you sweat (moderate-intensity exercise).  Do strengthening exercises at least twice a week. This is in addition to the moderate-intensity exercise.  Spend less time sitting. Even light physical activity can be beneficial. Watch cholesterol and blood lipids Have your blood tested for lipids and cholesterol at 45 years of age, then have this test  every 5 years. You may need to have your cholesterol levels checked more often if:  Your lipid or cholesterol levels are high.  You are older than 45 years of age.  You are at high risk for heart disease. What should I know about cancer screening? Many types of cancers can be detected early and may often be prevented. Depending on your health history and family history, you may need to have cancer screening at various ages. This may include screening for:  Colorectal cancer.  Prostate cancer.  Skin cancer.  Lung cancer. What should I know about heart disease, diabetes, and high blood pressure? Blood pressure and heart disease  High blood pressure causes heart disease and increases the risk of stroke. This is more likely to develop in people who have high blood pressure readings, are of African descent, or are overweight.  Talk with your health care provider about your target blood pressure readings.  Have your blood pressure checked: ? Every 3-5 years if you are 55-54 years of age. ? Every year if you are 66 years old or older.  If you are between the ages of 25 and 7 and are a current or former smoker, ask your health care provider if you should have a one-time screening for abdominal aortic aneurysm (AAA). Diabetes Have regular diabetes screenings. This checks your fasting blood sugar level. Have the screening done:  Once every three years after age 37 if you are at a normal weight and have a  low risk for diabetes.  More often and at a younger age if you are overweight or have a high risk for diabetes. What should I know about preventing infection? Hepatitis B If you have a higher risk for hepatitis B, you should be screened for this virus. Talk with your health care provider to find out if you are at risk for hepatitis B infection. Hepatitis C Blood testing is recommended for:  Everyone born from [birth year] through 1966.  Anyone with known risk factors for hepatitis  C. Sexually transmitted infections (STIs)  You should be screened each year for STIs, including gonorrhea and chlamydia, if: ? You are sexually active and are younger than 45 years of age. ? You are older than 45 years of age and your health care provider tells you that you are at risk for this type of infection. ? Your sexual activity has changed since you were last screened, and you are at increased risk for chlamydia or gonorrhea. Ask your health care provider if you are at risk.  Ask your health care provider about whether you are at high risk for HIV. Your health care provider may recommend a prescription medicine to help prevent HIV infection. If you choose to take medicine to prevent HIV, you should first get tested for HIV. You should then be tested every 3 months for as long as you are taking the medicine. Follow these instructions at home: Lifestyle  Do not use any products that contain nicotine or tobacco, such as cigarettes, e-cigarettes, and chewing tobacco. If you need help quitting, ask your health care provider.  Do not use street drugs.  Do not share needles.  Ask your health care provider for help if you need support or information about quitting drugs. Alcohol use  Do not drink alcohol if your health care provider tells you not to drink.  If you drink alcohol: ? Limit how much you have to 0-2 drinks a day. ? Be aware of how much alcohol is in your drink. In the [place] one drink equals one 12 oz bottle of beer (355 mL), one 5 oz glass of wine (148 mL), or one 1 oz glass of hard liquor (44 mL). General instructions  Schedule regular health, dental, and eye exams.  Stay current with your vaccines.  Tell your health care provider if: ? You often feel depressed. ? You have ever been abused or do not feel safe at home. Summary  Adopting a healthy lifestyle and getting preventive care are important in promoting health and wellness.  Follow your health care  provider's instructions about healthy diet, exercising, and getting tested or screened for diseases.  Follow your health care provider's instructions on monitoring your cholesterol and blood pressure. This information is not intended to replace advice given to you by your health care provider. Make sure you discuss any questions you have with your health care provider. Document Revised: 08/05/2018 Document Reviewed: 08/05/2018 Elsevier Patient Education  2020 [employer].

## 2020-02-22 NOTE — Assessment & Plan Note (Signed)
Patient continuing to work out and uses gabapentin and Robaxin from recent surgery.  States that these medications help and he is doing much better overall.  We will continue to follow.  Return precautions discussed, patient voiced understanding

## 2020-02-22 NOTE — Progress Notes (Signed)
    SUBJECTIVE:   Chief compliant/HPI: annual examination  Adam Snow is a 43 y.o. who presents today for an annual exam.   History tabs reviewed and updated.   Review of systems form reviewed and notable for no concerns.   OBJECTIVE:   BP 110/70   Pulse 84   Ht 6' (1.829 m)   Wt 234 lb 8 oz (106.4 kg)   SpO2 98%   BMI 31.80 kg/m   General: NAD, pleasant Eyes: no conjunctival pallor or injection ENTM: Moist mucous membranes Neck: Supple Cardiovascular: RRR, no m/r/g, no LE edema Respiratory: CTA BL, normal work of breathing MSK: moves 4 extremities equally Derm: no rashes appreciated Psych: AOx3, appropriate affect  ASSESSMENT/PLAN:   Status post lumbar surgery Patient continuing to work out and uses gabapentin and Robaxin from recent surgery.  States that these medications help and he is doing much better overall.  We will continue to follow.  Return precautions discussed, patient voiced understanding    Annual Examination  See AVS for recommendations.  PHQ 9 score 3, reviewed.  Blood pressure value is at goal.    Considered the following screening exams based upon USPSTF recommendations: Diabetes screening: ordered Screening for elevated cholesterol: ordered HIV testing: ordered Hepatitis C: ordered Hepatitis B: discussed Syphilis if at high risk: ordered Reviewed risk factors for latent tuberculosis and not indicated Colorectal cancer screening: discussed, colonoscopy ordered Immunizations: Given Tdap today, encourage Covid.   Follow up in 1 year or sooner if indicated.    [place] Siobahn Worsley, [place]

## 2020-02-23 LAB — CBC
Hematocrit: 44.6 % (ref 37.5–51.0)
Hemoglobin: 15 g/dL (ref 13.0–17.7)
MCH: 27.7 pg (ref 26.6–33.0)
MCHC: 33.6 g/dL (ref 31.5–35.7)
MCV: 82 fL (ref 79–97)
Platelets: 217 10*3/uL (ref 150–450)
RBC: 5.41 x10E6/uL (ref 4.14–5.80)
RDW: 13.1 % (ref 11.6–15.4)
WBC: 7 10*3/uL (ref 3.4–10.8)

## 2020-02-23 LAB — COMPREHENSIVE METABOLIC PANEL
ALT: 27 IU/L (ref 0–44)
AST: 21 IU/L (ref 0–40)
Albumin/Globulin Ratio: 1.6 (ref 1.2–2.2)
Albumin: 4.7 g/dL (ref 4.0–5.0)
Alkaline Phosphatase: 119 IU/L (ref 48–121)
BUN/Creatinine Ratio: 13 (ref 9–20)
BUN: 15 mg/dL (ref 6–24)
Bilirubin Total: <0.2 mg/dL (ref 0.0–1.2)
CO2: 21 mmol/L (ref 20–29)
Calcium: 9.7 mg/dL (ref 8.7–10.2)
Chloride: 106 mmol/L (ref 96–106)
Creatinine, Ser: 1.19 mg/dL (ref 0.76–1.27)
GFR calc Af Amer: 85 mL/min/{1.73_m2} (ref >59)
GFR calc non Af Amer: 73 mL/min/{1.73_m2} (ref >59)
Globulin, Total: 2.9 g/dL (ref 1.5–4.5)
Glucose: 104 mg/dL — ABNORMAL HIGH (ref 65–99)
Potassium: 3.9 mmol/L (ref 3.5–5.2)
Sodium: 143 mmol/L (ref 134–144)
Total Protein: 7.6 g/dL (ref 6.0–8.5)

## 2020-02-23 LAB — LIPID PANEL
Chol/HDL Ratio: 8 ratio — ABNORMAL HIGH (ref 0.0–5.0)
Cholesterol, Total: 231 mg/dL — ABNORMAL HIGH (ref 100–199)
HDL: 29 mg/dL — ABNORMAL LOW (ref >39)
LDL Chol Calc (NIH): 124 mg/dL — ABNORMAL HIGH (ref 0–99)
Triglycerides: 439 mg/dL — ABNORMAL HIGH (ref 0–149)
VLDL Cholesterol Cal: 78 mg/dL — ABNORMAL HIGH (ref 5–40)

## 2020-02-23 LAB — TSH: TSH: 1.04 u[IU]/mL (ref 0.450–4.500)

## 2020-02-23 LAB — HIV ANTIBODY (ROUTINE TESTING W REFLEX): HIV Screen 4th Generation wRfx: NONREACTIVE

## 2020-02-23 LAB — RPR: RPR Ser Ql: NONREACTIVE

## 2020-02-23 LAB — HEPATITIS C ANTIBODY: Hep C Virus Ab: <0.1 s/co ratio (ref 0.0–0.9)

## 2020-03-03 ENCOUNTER — Encounter: Payer: Self-pay | Admitting: Gastroenterology

## 2020-04-24 ENCOUNTER — Other Ambulatory Visit: Payer: Self-pay

## 2020-04-24 ENCOUNTER — Ambulatory Visit (AMBULATORY_SURGERY_CENTER): Payer: Self-pay

## 2020-04-24 ENCOUNTER — Other Ambulatory Visit: Payer: Medicaid Other

## 2020-04-24 VITALS — Ht 72.0 in | Wt 240.0 lb

## 2020-04-24 DIAGNOSIS — Z01818 Encounter for other preprocedural examination: Secondary | ICD-10-CM

## 2020-04-24 DIAGNOSIS — Z1211 Encounter for screening for malignant neoplasm of colon: Secondary | ICD-10-CM

## 2020-04-24 MED ORDER — NA SULFATE-K SULFATE-MG SULF 17.5-3.13-1.6 GM/177ML PO SOLN: 1.0000 | Freq: Once | ORAL | 0 refills | Status: AC

## 2020-04-24 NOTE — Progress Notes (Signed)
No egg or soy allergy known to patient  No issues with past sedation with any surgeries or procedures No intubation problems in the past  No FH of Malignant Hyperthermia No diet pills per patient No home 02 use per patient  No blood thinners per patient  Pt denies issues with constipation  No A fib or A flutter  EMMI video via MyChart  COVID 19 guidelines implemented in [place] today with Pt and RN  COVID screening on 05/05/2020 at 3:30 pm- patient is aware of appt date/time;  Due to the COVID-19 pandemic we are asking patients to follow these guidelines. Please only bring one care partner. Please be aware that your care partner may wait in the car in the parking lot or if they feel like they will be too hot to wait in the car, they may wait in the lobby on the 4th floor. All care partners are required to wear a mask the entire time (we do not have any that we can provide them), they need to practice social distancing, and we will do a Covid check for all patient's and care partners when you arrive. Also we will check their temperature and your temperature. If the care partner waits in their car they need to stay in the parking lot the entire time and we will call them on their cell phone when the patient is ready for discharge so they can bring the car to the front of the building. Also all patient's will need to wear a mask into building.

## 2020-05-05 ENCOUNTER — Ambulatory Visit (INDEPENDENT_AMBULATORY_CARE_PROVIDER_SITE_OTHER): Payer: Medicaid Other

## 2020-05-05 ENCOUNTER — Other Ambulatory Visit: Payer: Self-pay | Admitting: Gastroenterology

## 2020-05-05 DIAGNOSIS — Z1159 Encounter for screening for other viral diseases: Secondary | ICD-10-CM

## 2020-05-05 LAB — SARS CORONAVIRUS 2 (TAT 6-24 HRS): SARS Coronavirus 2: NEGATIVE

## 2020-05-08 ENCOUNTER — Other Ambulatory Visit: Payer: Self-pay

## 2020-05-08 ENCOUNTER — Ambulatory Visit (AMBULATORY_SURGERY_CENTER): Payer: Medicaid Other | Admitting: Gastroenterology

## 2020-05-08 ENCOUNTER — Encounter: Payer: Self-pay | Admitting: Gastroenterology

## 2020-05-08 VITALS — BP 121/82 | HR 70 | Temp 97.3°F | Resp 18 | Ht 72.0 in | Wt 240.0 lb

## 2020-05-08 DIAGNOSIS — D123 Benign neoplasm of transverse colon: Secondary | ICD-10-CM

## 2020-05-08 DIAGNOSIS — Z1211 Encounter for screening for malignant neoplasm of colon: Secondary | ICD-10-CM

## 2020-05-08 DIAGNOSIS — D124 Benign neoplasm of descending colon: Secondary | ICD-10-CM

## 2020-05-08 DIAGNOSIS — K635 Polyp of colon: Secondary | ICD-10-CM

## 2020-05-08 MED ORDER — SODIUM CHLORIDE 0.9 % IV SOLN: 500.0000 mL | Freq: Once | INTRAVENOUS | Status: DC

## 2020-05-08 NOTE — Progress Notes (Signed)
VS taken by S.B. 

## 2020-05-08 NOTE — Patient Instructions (Signed)
Handout on polyps given to you today  Await pathology results   YOU HAD AN ENDOSCOPIC PROCEDURE TODAY AT THE  ENDOSCOPY CENTER:   Refer to the procedure report that was given to you for any specific questions about what was found during the examination.  If the procedure report does not answer your questions, please call your gastroenterologist to clarify.  If you requested that your care partner not be given the details of your procedure findings, then the procedure report has been included in a sealed envelope for you to review at your convenience later.  YOU SHOULD EXPECT: Some feelings of bloating in the abdomen. Passage of more gas than usual.  Walking can help get rid of the air that was put into your GI tract during the procedure and reduce the bloating. If you had a lower endoscopy (such as a colonoscopy or flexible sigmoidoscopy) you may notice spotting of blood in your stool or on the toilet paper. If you underwent a bowel prep for your procedure, you may not have a normal bowel movement for a few days.  Please Note:  You might notice some irritation and congestion in your nose or some drainage.  This is from the oxygen used during your procedure.  There is no need for concern and it should clear up in a day or so.  SYMPTOMS TO REPORT IMMEDIATELY:   Following lower endoscopy (colonoscopy or flexible sigmoidoscopy):  Excessive amounts of blood in the stool  Significant tenderness or worsening of abdominal pains  Swelling of the abdomen that is new, acute  Fever of 100F or higher  For urgent or emergent issues, a gastroenterologist can be reached at any hour by calling [phone]. Do not use MyChart messaging for urgent concerns.    DIET:  We do recommend a small meal at first, but then you may proceed to your regular diet.  Drink plenty of fluids but you should avoid alcoholic beverages for 24 hours.  ACTIVITY:  You should plan to take it easy for the rest of today and  you should NOT DRIVE or use heavy machinery until tomorrow (because of the sedation medicines used during the test).    FOLLOW UP: Our staff will call the number listed on your records 48-72 hours following your procedure to check on you and address any questions or concerns that you may have regarding the information given to you following your procedure. If we do not reach you, we will leave a message.  We will attempt to reach you two times.  During this call, we will ask if you have developed any symptoms of COVID 19. If you develop any symptoms (ie: fever, flu-like symptoms, shortness of breath, cough etc.) before then, please call [phone].  If you test positive for Covid 19 in the 2 weeks post procedure, please call and report this information to us.    If any biopsies were taken you will be contacted by phone or by letter within the next 1-3 weeks.  Please call us at [phone] if you have not heard about the biopsies in 3 weeks.    SIGNATURES/CONFIDENTIALITY: You and/or your care partner have signed paperwork which will be entered into your electronic medical record.  These signatures attest to the fact that that the information above on your After Visit Summary has been reviewed and is understood.  Full responsibility of the confidentiality of this discharge information lies with you and/or your care-partner. 

## 2020-05-08 NOTE — Op Note (Signed)
[place] [facility] Patient Name: Adam Snow Procedure Date: 05/08/2020 9:41 AM MRN: [id number] Endoscopist: Mallie Mussel L. Loletha Carrow , MD Age: 44 Referring MD:  Date of Birth: [birth year] Gender: Male Account #: [id number] Procedure:                Colonoscopy Indications:              Screening for colorectal malignant neoplasm, This                            is the patient's first colonoscopy Medicines:                Monitored Anesthesia Care Procedure:                Pre-Anesthesia Assessment:                           - Prior to the procedure, a History and Physical                            was performed, and patient medications and                            allergies were reviewed. The patient's tolerance of                            previous anesthesia was also reviewed. The risks                            and benefits of the procedure and the sedation                            options and risks were discussed with the patient.                            All questions were answered, and informed consent                            was obtained. Prior Anticoagulants: The patient has                            taken no previous anticoagulant or antiplatelet                            agents. ASA Grade Assessment: II - A patient with                            mild systemic disease. After reviewing the risks                            and benefits, the patient was deemed in                            satisfactory condition to undergo the procedure.  After obtaining informed consent, the colonoscope                            was passed under direct vision. Throughout the                            procedure, the patient's blood pressure, pulse, and                            oxygen saturations were monitored continuously. The                            Colonoscope was introduced through the anus and                            advanced to the the  cecum, identified by                            appendiceal orifice and ileocecal valve. The                            colonoscopy was performed without difficulty. The                            patient tolerated the procedure well. The quality                            of the bowel preparation was good. The ileocecal                            valve, appendiceal orifice, and rectum were                            photographed. The bowel preparation used was SUPREP. Scope In: 9:50:20 AM Scope Out: 10:11:40 AM Scope Withdrawal Time: 0 hours 14 minutes 44 seconds  Total Procedure Duration: 0 hours 21 minutes 20 seconds  Findings:                 The perianal and digital rectal examinations were                            normal.                           A 8 mm polyp was found in the proximal transverse                            colon. The polyp was sessile. The polyp was removed                            with a cold snare. Resection and retrieval were                            complete.  A diminutive polyp was found in the proximal                            descending colon. The polyp was sessile. The polyp                            was removed with a cold snare. Resection and                            retrieval were complete.                           Internal hemorrhoids were found.                           The exam was otherwise without abnormality on                            direct and retroflexion views. Complications:            No immediate complications. Estimated Blood Loss:     Estimated blood loss was minimal. Impression:               - One 8 mm polyp in the proximal transverse colon,                            removed with a cold snare. Resected and retrieved.                           - One diminutive polyp in the proximal descending                            colon, removed with a cold snare. Resected and                             retrieved.                           - Internal hemorrhoids.                           - The examination was otherwise normal on direct                            and retroflexion views. Recommendation:           - Patient has a contact number available for                            emergencies. The signs and symptoms of potential                            delayed complications were discussed with the                            patient. Return to normal activities tomorrow.  Written discharge instructions were provided to the                            patient.                           - Resume previous diet.                           - Continue present medications.                           - Await pathology results.                           - Repeat colonoscopy is recommended for                            surveillance. The colonoscopy date will be                            determined after pathology results from today's                            exam become available for review. Jazilyn Siegenthaler L. Loletha Carrow, MD 05/08/2020 10:19:00 AM This report has been signed electronically.

## 2020-05-08 NOTE — Progress Notes (Signed)
PT taken to PACU. Monitors in place. VSS. Report given to RN. 

## 2020-05-08 NOTE — Progress Notes (Signed)
Called to room to assist during endoscopic procedure.  Patient ID and intended procedure confirmed with present staff. Received instructions for my participation in the procedure from the performing physician.  

## 2020-05-08 NOTE — Progress Notes (Signed)
Pt's states no medical or surgical changes since previsit or office visit. 

## 2020-05-09 ENCOUNTER — Telehealth: Payer: Self-pay | Admitting: Gastroenterology

## 2020-05-09 NOTE — Telephone Encounter (Signed)
Return call to pt.,he asked " Am I suppose to see blood in my urine, everytime I stand up I feel like I got to go and when I went this morning blood just shot out", asked pt.if he had any burning with that and he stated "yes twice". Informed Dr. Loletha Carrow of pt. Symptoms and he instructed pt. To see his PCP or urologist if he has one because his symptoms are not related to his procedure. Pt. Verbalized understanding.

## 2020-05-09 NOTE — Telephone Encounter (Signed)
Patients wife called states he is having blood in his urine and its painful when he goes. Might need to see Urologist but I advise a nurse would call

## 2020-05-10 ENCOUNTER — Telehealth: Payer: Self-pay | Admitting: *Deleted

## 2020-05-10 DIAGNOSIS — N3000 Acute cystitis without hematuria: Secondary | ICD-10-CM | POA: Diagnosis not present

## 2020-05-10 DIAGNOSIS — R310 Gross hematuria: Secondary | ICD-10-CM | POA: Diagnosis not present

## 2020-05-10 DIAGNOSIS — Z125 Encounter for screening for malignant neoplasm of prostate: Secondary | ICD-10-CM | POA: Diagnosis not present

## 2020-05-10 NOTE — Telephone Encounter (Signed)
  Follow up Call-  Call back number 05/08/2020  Post procedure Call Back phone  # [phone]  Permission to leave phone message Yes  Some recent data might be hidden     Patient questions:  Do you have a fever, pain , or abdominal swelling? No. Pain Score  0 *  Have you tolerated food without any problems? Yes.    Have you been able to return to your normal activities? Yes.    Do you have any questions about your discharge instructions: Diet   No. Medications  No. Follow up visit  No.  Do you have questions or concerns about your Care? No.  Actions: * If pain score is 4 or above: No action needed, pain <4.  1. Have you developed a fever since your procedure? no  2.   Have you had an respiratory symptoms (SOB or cough) since your procedure? no  3.   Have you tested positive for COVID 19 since your procedure no  4.   Have you had any family members/close contacts diagnosed with the COVID 19 since your procedure?  no   If yes to any of these questions please route to Joylene John, RN and Joella Prince, RN

## 2020-05-11 ENCOUNTER — Encounter (HOSPITAL_COMMUNITY): Payer: Self-pay | Admitting: Emergency Medicine

## 2020-05-11 ENCOUNTER — Emergency Department (HOSPITAL_COMMUNITY)
Admission: EM | Admit: 2020-05-11 | Discharge: 2020-05-11 | Disposition: A | Discharge status: Home / Self Care | Payer: Medicaid Other | Attending: Emergency Medicine | Admitting: Emergency Medicine

## 2020-05-11 ENCOUNTER — Emergency Department (HOSPITAL_COMMUNITY): Payer: Medicaid Other

## 2020-05-11 ENCOUNTER — Encounter: Payer: Self-pay | Admitting: Gastroenterology

## 2020-05-11 DIAGNOSIS — K828 Other specified diseases of gallbladder: Secondary | ICD-10-CM | POA: Diagnosis not present

## 2020-05-11 DIAGNOSIS — R319 Hematuria, unspecified: Secondary | ICD-10-CM | POA: Diagnosis not present

## 2020-05-11 DIAGNOSIS — R109 Unspecified abdominal pain: Secondary | ICD-10-CM | POA: Insufficient documentation

## 2020-05-11 DIAGNOSIS — I700 Atherosclerosis of aorta: Secondary | ICD-10-CM | POA: Diagnosis not present

## 2020-05-11 DIAGNOSIS — F17210 Nicotine dependence, cigarettes, uncomplicated: Secondary | ICD-10-CM | POA: Diagnosis not present

## 2020-05-11 LAB — URINALYSIS, ROUTINE W REFLEX MICROSCOPIC
Bilirubin Urine: NEGATIVE
Glucose, UA: 50 mg/dL — AB
Ketones, ur: 5 mg/dL — AB
Nitrite: NEGATIVE
Protein, ur: 100 mg/dL — AB
RBC / HPF: >50 RBC/hpf — ABNORMAL HIGH (ref 0–5)
Specific Gravity, Urine: 1.021 (ref 1.005–1.030)
WBC, UA: >50 WBC/hpf — ABNORMAL HIGH (ref 0–5)
pH: 5 (ref 5.0–8.0)

## 2020-05-11 LAB — I-STAT CHEM 8, ED
BUN: 14 mg/dL (ref 6–20)
Calcium, Ion: 1.15 mmol/L (ref 1.15–1.40)
Chloride: 100 mmol/L (ref 98–111)
Creatinine, Ser: 1.3 mg/dL — ABNORMAL HIGH (ref 0.61–1.24)
Glucose, Bld: 135 mg/dL — ABNORMAL HIGH (ref 70–99)
HCT: 45 % (ref 39.0–52.0)
Hemoglobin: 15.3 g/dL (ref 13.0–17.0)
Potassium: 3.6 mmol/L (ref 3.5–5.1)
Sodium: 136 mmol/L (ref 135–145)
TCO2: 24 mmol/L (ref 22–32)

## 2020-05-11 MED ORDER — ONDANSETRON HCL 4 MG PO TABS: 4.0000 mg | ORAL_TABLET | Freq: Three times a day (TID) | ORAL | 0 refills | Status: DC | PRN

## 2020-05-11 MED ORDER — OXYCODONE-ACETAMINOPHEN 5-325 MG PO TABS
1.0000 | ORAL_TABLET | Freq: Once | ORAL | Status: AC
  Administered 2020-05-11: 1 via ORAL
  Filled 2020-05-11: qty 1

## 2020-05-11 MED ORDER — OXYCODONE-ACETAMINOPHEN 5-325 MG PO TABS: 1.0000 | ORAL_TABLET | Freq: Four times a day (QID) | ORAL | 0 refills | Status: DC | PRN

## 2020-05-11 NOTE — ED Provider Notes (Signed)
[facility] EMERGENCY DEPARTMENT Provider Note   CSN: [id number] Arrival date & time: 05/11/20  1062     History No chief complaint on file.   Adam Snow is a 44 y.o. male with no significant past medical history who presents emergency department with chief complaint of hematuria and flank pain.  Patient has had several days of hematuria, urinary urgency with dysuria starting several days ago when he had a colonoscopy after using bowel prep.  Patient states that he saw a urologist who did blood work and got a urine sample told him he needed CT scan however she was unable to obtain 1.  Patient went to work today and around 4 in the morning started having severe right-sided flank pain, colicky, deep and achy.  He had said it was nonradiating had severe urgency to urinate without production of urine.  That lasted all the way until he got here and was given a Percocet he has had no return symptoms since that time.  He denies a history of kidney stones.  He has no current nausea vomiting.  He has no current urinary symptoms and he has.  He has no current urinary symptoms and he has no fever or chills.  HPI     Past Medical History:  Diagnosis Date   Migraine     Patient Active Problem List   Diagnosis Date Noted   Status post lumbar surgery 10/09/2017    Past Surgical History:  Procedure Laterality Date   FOOT SURGERY Bilateral 1998/2008   LUMBAR FUSION  2019   L5   PATELLA FRACTURE SURGERY Right 1989   WISDOM TOOTH EXTRACTION  1995       Family History  Problem Relation Age of Onset   Hyperlipidemia Mother    Hypertension Mother    Aneurysm Mother        brain   Cancer Father        lung primary he thinks and spread to heart and liver   Asthma Sister    Alcohol abuse Brother    Drug abuse Brother    Colon polyps Neg Hx    Colon cancer Neg Hx    Esophageal cancer Neg Hx    Stomach cancer Neg Hx    Rectal cancer Neg Hx      Social History   Tobacco Use   Smoking status: Current Every Day Smoker    Packs/day: 0.50    Types: Cigarettes   Smokeless tobacco: Never Used  Vaping Use   Vaping Use: Never used  Substance Use Topics   Alcohol use: No   Drug use: No    Home Medications Prior to Admission medications   Medication Sig Start Date End Date Taking? Authorizing Provider  gabapentin (NEURONTIN) 100 MG capsule Take 1 capsule (100 mg total) by mouth 3 (three) times daily. 02/22/20   Shirley, [place], DO  naproxen sodium (ALEVE) 220 MG tablet Take by mouth.    [provider]  Olopatadine HCl 0.2 % SOLN Place 1 drop into both eyes daily. 07/06/19   [provider]    Allergies    Other  Review of Systems   Review of Systems Ten systems reviewed and are negative for acute change, except as noted in the HPI.   Physical Exam Updated Vital Signs BP 111/71    Pulse 67    Temp 98.7 F (37.1 C) (Oral)    Resp 14    SpO2 97%   Physical  Exam Vitals and nursing note reviewed.  Constitutional:      General: He is not in acute distress.    Appearance: He is well-developed. He is not diaphoretic.  HENT:     Head: Normocephalic and atraumatic.  Eyes:     General: No scleral icterus.    Conjunctiva/sclera: Conjunctivae normal.  Cardiovascular:     Rate and Rhythm: Normal rate and regular rhythm.     Heart sounds: Normal heart sounds.  Pulmonary:     Effort: Pulmonary effort is normal. No respiratory distress.     Breath sounds: Normal breath sounds.  Abdominal:     Palpations: Abdomen is soft.     Tenderness: There is no abdominal tenderness. There is no right CVA tenderness or left CVA tenderness.  Musculoskeletal:     Cervical back: Normal range of motion and neck supple.  Skin:    General: Skin is warm and dry.  Neurological:     Mental Status: He is alert.  Psychiatric:        Behavior: Behavior normal.     ED Results / Procedures / Treatments   Labs (all  labs ordered are listed, but only abnormal results are displayed) Labs Reviewed  URINALYSIS, ROUTINE W REFLEX MICROSCOPIC - Abnormal; Notable for the following components:      Result Value   Color, Urine AMBER (*)    APPearance CLOUDY (*)    Glucose, UA 50 (*)    Hgb urine dipstick LARGE (*)    Ketones, ur 5 (*)    Protein, ur 100 (*)    Leukocytes,Ua MODERATE (*)    RBC / HPF >50 (*)    WBC, UA >50 (*)    Bacteria, UA RARE (*)    All other components within normal limits  I-STAT CHEM 8, ED - Abnormal; Notable for the following components:   Creatinine, Ser 1.30 (*)    Glucose, Bld 135 (*)    All other components within normal limits    EKG None  Radiology CT Renal Stone Study  Result Date: 05/11/2020 CLINICAL DATA:  Right flank pain EXAM: CT ABDOMEN AND PELVIS WITHOUT CONTRAST TECHNIQUE: Multidetector CT imaging of the abdomen and pelvis was performed following the standard protocol without IV contrast. COMPARISON:  None FINDINGS: Lower chest: Lung bases are clear. No effusions. Heart is normal size. Hepatobiliary: Small gallstone versus focal calcification within the gallbladder wall near the fundus. No focal hepatic abnormality. Pancreas: Few calcifications within the pancreatic tail compatible with chronic pancreatitis. Spleen: No focal abnormality.  Normal size. Adrenals/Urinary Tract: No adrenal abnormality. No focal renal abnormality. No stones or hydronephrosis. Urinary bladder is unremarkable. Stomach/Bowel: Stomach, large and small bowel grossly unremarkable. Normal appendix. Vascular/Lymphatic: Aortic atherosclerosis. No evidence of aneurysm or adenopathy. Reproductive: No visible focal abnormality. Other: No free fluid or free air. Musculoskeletal: No acute bony abnormality. IMPRESSION: Calcification near the fundus of the gallbladder could reflect a small stone or focal wall calcification. No renal or ureteral stones.  No hydronephrosis. Aortic atherosclerosis. No acute  findings. Electronically Signed   By: Rolm Baptise M.D.   On: 05/11/2020 10:36    Procedures Procedures (including critical care time)  Medications Ordered in ED Medications  oxyCODONE-acetaminophen (PERCOCET/ROXICET) 5-325 MG per tablet 1 tablet (1 tablet Oral Given 05/11/20 0955)    ED Course  I have reviewed the triage vital signs and the nursing notes.  Pertinent labs & imaging results that were available during my care of the patient were reviewed  by me and considered in my medical decision making (see chart for details).    MDM Rules/Calculators/A&P                           44 year old male here with flank pain. The differential diagnosis of emergent flank pain includes, but is not limited to :Abdominal aortic aneurysm,, Renal artery embolism,Renal vein thrombosis, Aortic dissection, Mesenteric ischemia, Pyelonephritis, Renal infarction, Renal hemorrhage, Nephrolithiasis/ Renal Colic, Bladder tumor,Cystitis, Biliary colic, Pancreatitis Perforated peptic ulcer Appendicitis ,Inguinal Hernia, Diverticulitis, Bowel obstruction  Shingles Lower lobe pneumonia, Retroperitoneal hematoma/abscess/tumor, Epidural abscess, Epidural hematoma The patient was seen he here in the emergency department.  Urine is positive for large amount of hemoglobin.  He is not having fevers or any active flank pain on my examination.  Chem-8 shows a slight elevation in his creatinine.  The patient has easy access to outpatient follow-up.  I personally reviewed the patient's CT renal stone study.  He has no right upper quadrant tenderness on examination and I have very low suspicion that this has to do with his gallbladder.  The CT findings are otherwise unremarkable with no evidence of hydronephrosis.  I suspect the patient very likely passed a kidney stone.  He has had no active pain at this time.  Patient informed of his results.  Given a narcotic and antiemetic medications at discharge.  Work note provided.   Patient is to follow very closely with his urologist.  He is advised to return for uncontrollable pain, inability to urinate, nausea, vomiting, fever.  He is advised to have his creatinine rechecked in 1 week as there is an elevation today which I likely suspect is transient due to recently passed stone.  Final Clinical Impression(s) / ED Diagnoses Final diagnoses:  None      Rx / DC Orders ED Discharge Orders    None       Margarita Mail, PA-C 05/11/20 [place], MD 05/12/20 1439

## 2020-05-11 NOTE — ED Triage Notes (Signed)
Pt here from home with c/o right side flank pain and blood in his urine , pt saw urologist yesterday but could not get him in for a scan , came here for scan

## 2020-05-11 NOTE — Discharge Instructions (Addendum)
1) please get your CREATININE rechecked in 1 week. Contact a health care provider if: You have a fever or chills. Your urine smells bad or looks cloudy. You have pain or burning when you pass urine. Get help right away if: Your flank pain or groin pain suddenly worsens. You become confused or disoriented or you lose consciousness.

## 2020-05-18 DIAGNOSIS — Z125 Encounter for screening for malignant neoplasm of prostate: Secondary | ICD-10-CM | POA: Diagnosis not present

## 2020-05-18 DIAGNOSIS — D414 Neoplasm of uncertain behavior of bladder: Secondary | ICD-10-CM | POA: Diagnosis not present

## 2020-05-18 DIAGNOSIS — R310 Gross hematuria: Secondary | ICD-10-CM | POA: Diagnosis not present

## 2020-05-25 ENCOUNTER — Other Ambulatory Visit: Payer: Self-pay | Admitting: Urology

## 2020-05-25 MED ORDER — GEMCITABINE CHEMO FOR BLADDER INSTILLATION 2000 MG: 2000.0000 mg | Freq: Once | INTRAVENOUS | Status: DC

## 2020-06-06 ENCOUNTER — Other Ambulatory Visit: Payer: Self-pay

## 2020-06-06 ENCOUNTER — Encounter (HOSPITAL_BASED_OUTPATIENT_CLINIC_OR_DEPARTMENT_OTHER): Payer: Self-pay | Admitting: Urology

## 2020-06-06 ENCOUNTER — Other Ambulatory Visit (HOSPITAL_COMMUNITY)
Admission: RE | Admit: 2020-06-06 | Discharge: 2020-06-06 | Disposition: A | Discharge status: Home / Self Care | Payer: Medicaid Other | Source: Ambulatory Visit | Attending: Urology | Admitting: Urology

## 2020-06-06 DIAGNOSIS — Z01818 Encounter for other preprocedural examination: Secondary | ICD-10-CM | POA: Insufficient documentation

## 2020-06-06 DIAGNOSIS — Z20822 Contact with and (suspected) exposure to covid-19: Secondary | ICD-10-CM | POA: Insufficient documentation

## 2020-06-06 LAB — SARS CORONAVIRUS 2 (TAT 6-24 HRS): SARS Coronavirus 2: NEGATIVE

## 2020-06-06 NOTE — Progress Notes (Signed)
Spoke w/ via phone for pre-op interview---pt Lab needs dos----   none            Lab results------none COVID test -----06-06-2020 900- Arrive at -------1000 am 06-09-2020 NPO after MN NO Solid Food.  Clear liquids from MN until---900 am then npo Medications to take morning of surgery -----none Diabetic medication -----n/a Patient Special Instructions -----none Pre-Op special Istructions -----none Patient verbalized understanding of instructions that were given at this phone interview. Patient denies shortness of breath, chest pain, fever, cough at this phone interview.

## 2020-06-09 ENCOUNTER — Ambulatory Visit (HOSPITAL_BASED_OUTPATIENT_CLINIC_OR_DEPARTMENT_OTHER): Payer: Medicaid Other | Admitting: Anesthesiology

## 2020-06-09 ENCOUNTER — Ambulatory Visit (HOSPITAL_BASED_OUTPATIENT_CLINIC_OR_DEPARTMENT_OTHER)
Admission: RE | Admit: 2020-06-09 | Discharge: 2020-06-09 | Disposition: A | Discharge status: Home / Self Care | Payer: Medicaid Other | Attending: Urology | Admitting: Urology

## 2020-06-09 ENCOUNTER — Other Ambulatory Visit: Payer: Self-pay | Admitting: Urology

## 2020-06-09 ENCOUNTER — Encounter (HOSPITAL_BASED_OUTPATIENT_CLINIC_OR_DEPARTMENT_OTHER)
Admission: RE | Disposition: A | Discharge status: Home / Self Care | Payer: Self-pay | Source: Home / Self Care | Attending: Urology

## 2020-06-09 ENCOUNTER — Encounter (HOSPITAL_BASED_OUTPATIENT_CLINIC_OR_DEPARTMENT_OTHER): Payer: Self-pay | Admitting: Urology

## 2020-06-09 ENCOUNTER — Other Ambulatory Visit: Payer: Self-pay

## 2020-06-09 DIAGNOSIS — Z801 Family history of malignant neoplasm of trachea, bronchus and lung: Secondary | ICD-10-CM | POA: Insufficient documentation

## 2020-06-09 DIAGNOSIS — Z800 Family history of malignant neoplasm of digestive organs: Secondary | ICD-10-CM | POA: Insufficient documentation

## 2020-06-09 DIAGNOSIS — N3289 Other specified disorders of bladder: Secondary | ICD-10-CM

## 2020-06-09 DIAGNOSIS — Z79899 Other long term (current) drug therapy: Secondary | ICD-10-CM | POA: Insufficient documentation

## 2020-06-09 DIAGNOSIS — N329 Bladder disorder, unspecified: Secondary | ICD-10-CM | POA: Insufficient documentation

## 2020-06-09 DIAGNOSIS — D494 Neoplasm of unspecified behavior of bladder: Secondary | ICD-10-CM | POA: Diagnosis not present

## 2020-06-09 DIAGNOSIS — N35912 Unspecified bulbous urethral stricture, male: Secondary | ICD-10-CM | POA: Insufficient documentation

## 2020-06-09 DIAGNOSIS — F17210 Nicotine dependence, cigarettes, uncomplicated: Secondary | ICD-10-CM | POA: Diagnosis not present

## 2020-06-09 HISTORY — PX: TRANSURETHRAL RESECTION OF BLADDER TUMOR: SHX2575

## 2020-06-09 HISTORY — DX: Other specified disorders of bladder: N32.89

## 2020-06-09 SURGERY — TURBT (TRANSURETHRAL RESECTION OF BLADDER TUMOR)
Anesthesia: General

## 2020-06-09 MED ORDER — PROPOFOL 10 MG/ML IV BOLUS
INTRAVENOUS | Status: DC | PRN
  Administered 2020-06-09: 200 mg via INTRAVENOUS

## 2020-06-09 MED ORDER — LIDOCAINE 2% (20 MG/ML) 5 ML SYRINGE
INTRAMUSCULAR | Status: AC
  Filled 2020-06-09: qty 5

## 2020-06-09 MED ORDER — FENTANYL CITRATE (PF) 100 MCG/2ML IJ SOLN
INTRAMUSCULAR | Status: DC | PRN
Start: 2020-06-09 — End: 2020-06-09
  Administered 2020-06-09: 50 ug via INTRAVENOUS
  Administered 2020-06-09 (×2): 25 ug via INTRAVENOUS

## 2020-06-09 MED ORDER — PROPOFOL 10 MG/ML IV BOLUS
INTRAVENOUS | Status: AC
  Filled 2020-06-09: qty 20

## 2020-06-09 MED ORDER — LACTATED RINGERS IV SOLN: INTRAVENOUS | Status: DC

## 2020-06-09 MED ORDER — ONDANSETRON HCL 4 MG/2ML IJ SOLN
INTRAMUSCULAR | Status: DC | PRN
  Administered 2020-06-09: 4 mg via INTRAVENOUS

## 2020-06-09 MED ORDER — KETOROLAC TROMETHAMINE 30 MG/ML IJ SOLN
INTRAMUSCULAR | Status: AC
  Filled 2020-06-09: qty 1

## 2020-06-09 MED ORDER — AMISULPRIDE (ANTIEMETIC) 5 MG/2ML IV SOLN: 10.0000 mg | Freq: Once | INTRAVENOUS | Status: DC | PRN

## 2020-06-09 MED ORDER — DOCUSATE SODIUM 100 MG PO CAPS: 100.0000 mg | ORAL_CAPSULE | Freq: Every day | ORAL | 0 refills | Status: DC | PRN

## 2020-06-09 MED ORDER — ONDANSETRON HCL 4 MG/2ML IJ SOLN: 4.0000 mg | Freq: Once | INTRAMUSCULAR | Status: DC | PRN

## 2020-06-09 MED ORDER — OXYCODONE-ACETAMINOPHEN 5-325 MG PO TABS
1.0000 | ORAL_TABLET | ORAL | 0 refills | Status: DC | PRN
Start: 2020-06-09 — End: 2020-06-09

## 2020-06-09 MED ORDER — LIDOCAINE HCL (CARDIAC) PF 100 MG/5ML IV SOSY
PREFILLED_SYRINGE | INTRAVENOUS | Status: DC | PRN
  Administered 2020-06-09: 60 mg via INTRAVENOUS

## 2020-06-09 MED ORDER — SODIUM CHLORIDE 0.9 % IR SOLN
Status: DC | PRN
  Administered 2020-06-09: 3000 mL

## 2020-06-09 MED ORDER — CEFAZOLIN SODIUM-DEXTROSE 2-4 GM/100ML-% IV SOLN
2.0000 g | Freq: Once | INTRAVENOUS | Status: AC
  Administered 2020-06-09: 2 g via INTRAVENOUS

## 2020-06-09 MED ORDER — OXYCODONE HCL 5 MG PO TABS
ORAL_TABLET | ORAL | Status: AC
  Filled 2020-06-09: qty 1

## 2020-06-09 MED ORDER — IOHEXOL 300 MG/ML  SOLN
INTRAMUSCULAR | Status: DC | PRN
  Administered 2020-06-09: 10 mL via URETHRAL

## 2020-06-09 MED ORDER — DEXAMETHASONE SODIUM PHOSPHATE 4 MG/ML IJ SOLN
INTRAMUSCULAR | Status: DC | PRN
  Administered 2020-06-09: 5 mg via INTRAVENOUS

## 2020-06-09 MED ORDER — OXYCODONE HCL 5 MG PO TABS
5.0000 mg | ORAL_TABLET | Freq: Once | ORAL | Status: AC | PRN
  Administered 2020-06-09: 5 mg via ORAL

## 2020-06-09 MED ORDER — DEXAMETHASONE SODIUM PHOSPHATE 10 MG/ML IJ SOLN
INTRAMUSCULAR | Status: AC
  Filled 2020-06-09: qty 1

## 2020-06-09 MED ORDER — MIDAZOLAM HCL 5 MG/5ML IJ SOLN
INTRAMUSCULAR | Status: DC | PRN
  Administered 2020-06-09: 2 mg via INTRAVENOUS

## 2020-06-09 MED ORDER — ONDANSETRON HCL 4 MG/2ML IJ SOLN
INTRAMUSCULAR | Status: AC
  Filled 2020-06-09: qty 2

## 2020-06-09 MED ORDER — FENTANYL CITRATE (PF) 250 MCG/5ML IJ SOLN
INTRAMUSCULAR | Status: AC
  Filled 2020-06-09: qty 5

## 2020-06-09 MED ORDER — STERILE WATER FOR IRRIGATION IR SOLN
Status: DC | PRN
  Administered 2020-06-09: 2000 mL

## 2020-06-09 MED ORDER — CEFAZOLIN SODIUM-DEXTROSE 2-4 GM/100ML-% IV SOLN
INTRAVENOUS | Status: AC
  Filled 2020-06-09: qty 100

## 2020-06-09 MED ORDER — FENTANYL CITRATE (PF) 100 MCG/2ML IJ SOLN: 25.0000 ug | INTRAMUSCULAR | Status: DC | PRN

## 2020-06-09 MED ORDER — OXYCODONE-ACETAMINOPHEN 5-325 MG PO TABS
1.0000 | ORAL_TABLET | ORAL | 0 refills | Status: DC | PRN
Start: 2020-06-09 — End: 2020-10-30

## 2020-06-09 MED ORDER — MIDAZOLAM HCL 2 MG/2ML IJ SOLN
INTRAMUSCULAR | Status: AC
  Filled 2020-06-09: qty 2

## 2020-06-09 MED ORDER — DOCUSATE SODIUM 100 MG PO CAPS: 100.0000 mg | ORAL_CAPSULE | Freq: Two times a day (BID) | ORAL | 0 refills | Status: AC

## 2020-06-09 MED ORDER — ROCURONIUM BROMIDE 10 MG/ML (PF) SYRINGE
PREFILLED_SYRINGE | INTRAVENOUS | Status: AC
  Filled 2020-06-09: qty 10

## 2020-06-09 MED ORDER — KETOROLAC TROMETHAMINE 30 MG/ML IJ SOLN
INTRAMUSCULAR | Status: DC | PRN
  Administered 2020-06-09: 30 mg via INTRAVENOUS

## 2020-06-09 MED ORDER — OXYCODONE HCL 5 MG/5ML PO SOLN: 5.0000 mg | Freq: Once | ORAL | Status: AC | PRN

## 2020-06-09 SURGICAL SUPPLY — 36 items
BAG DRAIN URO-CYSTO SKYTR STRL (DRAIN) ×2 IMPLANT
BAG DRN RND TRDRP ANRFLXCHMBR (UROLOGICAL SUPPLIES) ×1
BAG DRN UROCATH (DRAIN) ×1
BAG URINE DRAIN 2000ML AR STRL (UROLOGICAL SUPPLIES) ×1 IMPLANT
BAG URINE LEG 500ML (DRAIN) IMPLANT
CATH FOLEY 2W COUNCIL 5CC 18FR (CATHETERS) ×1 IMPLANT
CATH FOLEY 2WAY SLVR  5CC 22FR (CATHETERS)
CATH FOLEY 2WAY SLVR 30CC 20FR (CATHETERS) IMPLANT
CATH FOLEY 2WAY SLVR 5CC 22FR (CATHETERS) IMPLANT
CATH SET URETHRAL DILATOR (CATHETERS) ×1 IMPLANT
CLOTH BEACON ORANGE TIMEOUT ST (SAFETY) ×2 IMPLANT
CNTNR URN SCR LID CUP LEK RST (MISCELLANEOUS) IMPLANT
CONT SPEC 4OZ STRL OR WHT (MISCELLANEOUS) ×2
DRSG TELFA 3X8 NADH (GAUZE/BANDAGES/DRESSINGS) ×2 IMPLANT
ELECT REM PT RETURN 9FT ADLT (ELECTROSURGICAL) ×2
ELECTRODE REM PT RTRN 9FT ADLT (ELECTROSURGICAL) ×1 IMPLANT
EVACUATOR MICROVAS BLADDER (UROLOGICAL SUPPLIES) IMPLANT
GLOVE BIO SURGEON STRL SZ7 (GLOVE) IMPLANT
GOWN STRL REUS W/ TWL LRG LVL3 (GOWN DISPOSABLE) ×1 IMPLANT
GOWN STRL REUS W/TWL LRG LVL3 (GOWN DISPOSABLE) ×2
GUIDEWIRE ANG ZIPWIRE 038X150 (WIRE) ×1 IMPLANT
GUIDEWIRE STR DUAL SENSOR (WIRE) ×1 IMPLANT
HOLDER FOLEY CATH W/STRAP (MISCELLANEOUS) ×1 IMPLANT
IV NS IRRIG 3000ML ARTHROMATIC (IV SOLUTION) ×1 IMPLANT
KIT TURNOVER CYSTO (KITS) ×2 IMPLANT
LOOP CUT BIPOLAR 24F LRG (ELECTROSURGICAL) IMPLANT
MANIFOLD NEPTUNE II (INSTRUMENTS) IMPLANT
PACK CYSTO (CUSTOM PROCEDURE TRAY) ×2 IMPLANT
PAD DRESSING TELFA 3X8 NADH (GAUZE/BANDAGES/DRESSINGS) IMPLANT
PLUG CATH AND CAP STER (CATHETERS) ×1 IMPLANT
SYR 10ML LL (SYRINGE) ×1 IMPLANT
SYR TOOMEY IRRIG 70ML (MISCELLANEOUS) ×2
SYRINGE TOOMEY IRRIG 70ML (MISCELLANEOUS) IMPLANT
TUBE CONNECTING 12X1/4 (SUCTIONS) ×1 IMPLANT
WATER STERILE IRR 3000ML UROMA (IV SOLUTION) ×1 IMPLANT
WATER STERILE IRR 500ML POUR (IV SOLUTION) ×1 IMPLANT

## 2020-06-09 NOTE — Discharge Instructions (Signed)
Activity:  You are encouraged to ambulate frequently (about every hour during waking hours) to help prevent blood clots from forming in your legs or lungs.  However, you should not engage in any heavy lifting (> 10-15 lbs), strenuous activity, or straining.   Diet: You should advance your diet as instructed by your physician.  It will be normal to have some bloating, nausea, and abdominal discomfort intermittently.   Prescriptions:  You will be provided a prescription for pain medication to take as needed.  If your pain is not severe enough to require the prescription pain medication, you may take extra strength Tylenol instead which will have less side effects.  You should also take a prescribed stool softener to avoid straining with bowel movements as the prescription pain medication may constipate you.   What to call us about: You should call the office [phone]) if you develop fever > 101 or develop persistent vomiting. Activity:  You are encouraged to ambulate frequently (about every hour during waking hours) to help prevent blood clots from forming in your legs or lungs.  However, you should not engage in any heavy lifting (> 10-15 lbs), strenuous activity, or straining.  Post Anesthesia Home Care Instructions  Activity: Get plenty of rest for the remainder of the day. A responsible adult should stay with you for 24 hours following the procedure.  For the next 24 hours, DO NOT: -Drive a car -Paediatric nurse -Drink alcoholic beverages -Take any medication unless instructed by your physician -Make any legal decisions or sign important papers.  Meals: Start with liquid foods such as gelatin or soup. Progress to regular foods as tolerated. Avoid greasy, spicy, heavy foods. If nausea and/or vomiting occur, drink only clear liquids until the nausea and/or vomiting subsides. Call your physician if vomiting continues.  Special Instructions/Symptoms: Your throat may feel dry or sore  from the anesthesia or the breathing tube placed in your throat during surgery. If this causes discomfort, gargle with warm salt water. The discomfort should disappear within 24 hours.  If you had a scopolamine patch placed behind your ear for the management of post- operative nausea and/or vomiting:  1. The medication in the patch is effective for 72 hours, after which it should be removed.  Wrap patch in a tissue and discard in the trash. Wash hands thoroughly with soap and water. 2. You may remove the patch earlier than 72 hours if you experience unpleasant side effects which may include dry mouth, dizziness or visual disturbances. 3. Avoid touching the patch. Wash your hands with soap and water after contact with the patch.    Transurethral Resection of Bladder Tumor  Transurethral resection of a bladder tumor is the removal (resection) of a cancerous growth (tumor) on the inside wall of the bladder. The bladder is the organ that holds urine. The tumor is removed through the tube that carries urine out of the body (urethra). In a transurethral resection, a thin telescope with a light, a tiny camera, and an electric cutting edge (resectoscope) is passed through the urethra. In men, the opening of the urethra is at the end of the penis. In women, it is just above the opening of the vagina. Tell a health care provider about:  Any allergies you have.  All medicines you are taking, including vitamins, herbs, eye drops, creams, and over-the-counter medicines.  Any problems you or family members have had with anesthetic medicines.  Any blood disorders you have.  Any surgeries you have  had.  Any medical conditions you have.  Any recent urinary tract infections you have had.  Whether you are pregnant or may be pregnant. What are the risks? Generally, this is a safe procedure. However, problems may occur, including:  Infection.  Bleeding.  Allergic reactions to medicines.  Damage to  nearby structures or organs, such as: ? The urethra. ? The tubes that drain urine from the kidneys into the bladder (ureters).  Pain and burning during urination.  Difficulty urinating due to partial blockage of the urethra.  Inability to urinate (urinary retention). What happens before the procedure? Staying hydrated Follow instructions from your health care provider about hydration, which may include:  Up to 2 hours before the procedure - you may continue to drink clear liquids, such as water, clear fruit juice, black coffee, and plain tea.  Eating and drinking restrictions Follow instructions from your health care provider about eating and drinking, which may include:  8 hours before the procedure - stop eating heavy meals or foods, such as meat, fried foods, or fatty foods.  6 hours before the procedure - stop eating light meals or foods, such as toast or cereal.  6 hours before the procedure - stop drinking milk or drinks that contain milk.  2 hours before the procedure - stop drinking clear liquids. Medicines Ask your health care provider about:  Changing or stopping your regular medicines. This is especially important if you are taking diabetes medicines or blood thinners.  Taking medicines such as aspirin and ibuprofen. These medicines can thin your blood. Do not take these medicines unless your health care provider tells you to take them.  Taking over-the-counter medicines, vitamins, herbs, and supplements. Tests You may have exams or tests, including:  Physical exam.  Blood tests.  Urine tests.  Electrocardiogram (ECG). This test measures the electrical activity of the heart. General instructions  Plan to have someone take you home from the hospital or clinic.  Ask your health care provider how your surgical site will be marked or identified.  Ask your health care provider what steps will be taken to help prevent infection. These may include: ? Washing skin  with a germ-killing soap. ? Taking antibiotic medicine. What happens during the procedure?  An IV will be inserted into one of your veins.  You will be given one or more of the following: ? A medicine to help you relax (sedative). ? A medicine to make you fall asleep (general anesthetic). ? A medicine that is injected into your spine to numb the area below and slightly above the injection site (spinal anesthetic).  Your legs will be placed in foot rests (stirrups) so that your legs are apart and your knees are bent.  The resectoscope will be passed through your urethra and into your bladder.  The part of your bladder that is affected by the tumor will be resected using the cutting edge of the resectoscope.  The resectoscope will be removed.  A thin, flexible tube (catheter) will be passed through your urethra and into your bladder. The catheter will drain urine into a bag outside of your body. ? Fluid may be passed through the catheter to keep the catheter open. The procedure may vary among health care providers and hospitals. What happens after the procedure?  Your blood pressure, heart rate, breathing rate, and blood oxygen level will be monitored until you leave the hospital or clinic.  You may continue to receive fluids and medicines through an IV.  You will have some pain. You will be given pain medicine to relieve pain.  You will have a catheter to drain your urine. ? You will have blood in your urine. Your catheter may be kept in until your urine is clear. ? The amount of urine will be monitored. If necessary, your bladder may be rinsed out (irrigated) by passing fluid through your catheter.  You will be encouraged to walk around as soon as possible.  You may have to wear compression stockings. These stockings help to prevent blood clots and reduce swelling in your legs.  Do not drive for 24 hours if you were given a sedative during your  procedure. Summary  Transurethral resection of a bladder tumor is the removal (resection) of a cancerous growth (tumor) on the inside wall of the bladder.  To do this procedure, your health care provider uses a thin telescope with a light, a tiny camera, and an electric cutting edge (resectoscope).  Follow your health care provider's instructions. You may need to stop or change certain medicines, and you may be told to stop eating and drinking several hours before the procedure.  Your blood pressure, heart rate, breathing rate, and blood oxygen level will be monitored until you leave the hospital or clinic.  You may have to wear compression stockings. These stockings help to prevent blood clots and reduce swelling in your legs. This information is not intended to replace advice given to you by your health care provider. Make sure you discuss any questions you have with your health care provider. Document Revised: 03/13/2018 Document Reviewed: 03/13/2018 Elsevier Patient Education  [place].

## 2020-06-09 NOTE — Op Note (Signed)
Operative Note  Preoperative diagnosis:  1.  Bladder lesions 2. Gross hematuria  Postoperative diagnosis: 1.  Bladder lesions 2. Bulbar urethral stricture 3. Gross hematuria  Procedure(s): 1.  Cystoscopy 2. Bilateral retrograde pyelograms 3. Urethral dilation 4. Bladder biopsy 5. Fulguration of bladder  Surgeon: Rexene Alberts, MD  Assistants:  None  Anesthesia:  General  Complications:  None  EBL:  69ml  Specimens: 1.  ID Type Source Tests Collected by Time Destination  1 : posterior bladder wall biopsy Tissue PATH GU biopsy SURGICAL PATHOLOGY Janith Lima, MD 06/09/2020 1308   2 : bladder dome biopsy Tissue PATH GU biopsy SURGICAL PATHOLOGY Janith Lima, MD 06/09/2020 1309   A : urine washing Body Fluid PATH Cytology washing CYTOLOGY - NON PAP Janith Lima, MD 06/09/2020 1306    Drains/Catheters: 1.  18 Fr council catheter  Intraoperative findings:   1. Urethral stricture in bulbar urethra, with small flap created with attempt to pass cystoscope. Dilated successfully lumen from 8 fr to 22 Fr with cook urethral dilators. 2. Bilateral retrograde pyelograms with no hydronephrosis or filling defects. Contrast drained promptly. 3. Erythematous lesions on posterior wall and dome. Not suspicious for CIS with this view. No papillary lesions. Deep biopsy obtained at each site with excellent hemostasis. No evidence of perforation. 4. Successful 18 Fr foley catheter placed. Irrigated pink tinge. Bleeding likely from around bulbar urethra dilation site.  Indication:  Adam Snow is a 44 y.o. male with gross hematuria. Cystoscopy in the office with erythematous lesions at that time suspicious for CIS. All the risks, benefits were discussed with the patient to include but not limited to infection, pain, bleeding, damage to adjacent structures, need for further operations, adverse reaction to anesthesia and death.  Patient understands these risks and agrees to proceed with  the operation as planned.    Description of procedure: After informed consent was obtained from the patient, the patient was taken to the operating room. General anesthesia was administered. The patient was placed in dorsal lithotomy position and prepped and draped in usual sterile fashion. Sequential compression devices were applied to lower extremities at the beginning of the case for DVT prophylaxis. Antibiotics were infused prior to surgery start time. A surgical time-out was performed to properly identify the patient, the surgery to be performed, and the surgical site.     We then passed the 21-French rigid cystoscope down the urethra.  We encountered 2 separate bulbar urethral strictures.  The most distal was wide caliber and dilated slightly with a passage of scope.  The next one was approximately 28 [place].  The tarted gentle passage through this stricture however a small flap was raised.  I then passed a 0.038 sensor wire through the lumen into the bladder.  Over this wire I passed a 5 [place] open-ended catheter draining clear yellow urine to confirm proper placement in the bladder.  I then replaced the sensor wire and over this passed Cook urethral dilators from 8 [place] to 22 [place].  Leaving the wire in place, I then reintroduced the 21 French cystoscope and passed this into the bladder under direct vision.  There was evidence for a flap and trauma site at the bulbar urethra from prior bulbar urethral stricture site.  This prostate was mildly obstructing with bilateral lobar adenoma present.  I then surveyed the bladder with a 30 and 70 degree lenses and did not identify any papillary lesions.  There were 2 separate erythematous lesions located in  the posterior aspect the bladder and the dome.  On this view, it was not suspicious for CIS.  We then passed a 0.038 zip wire into the right ureteral orifice and over this passed a 5 [place] open-ended catheter into the right distal ureter.  Of note the  right distal ureter was tight.  We then performed a gentle right retrograde pyelogram demonstrating no hydronephrosis, no filling defects.  The contrast drained promptly in the calyces within a delicate.  In a similar fashion, we introduced a 0.038 zip wire into the left ureteral orifice and over this passive preference open-ended catheter into the left distal ureter.  The left ureteral orifice was also similarly tight.  We then performed a gentle left retrograde pyelogram noting no hydronephrosis, no filling defects.  The calyces were thin and delicate and the contrast drained promptly.  We then introduced a cold cup biopsy and biopsy of the erythematous lesion site at the posterior aspect of the bladder.  This was a deep biopsy and had some bleeding immediately.  We then biopsied the dome.  This was also a deep biopsy to include muscle.  We then introduced the [place] and fulgurated both of the sites with excellent hemostasis.  There is no evidence for any perforation.  We then withdrew the scope and there is some evidence of bleeding from the prior bulbar urethra.  We then passed a 33 [place] council urethral catheter over the wire and into the bladder with return of slight pink-tinged urine.  Then irrigated with a [place] syringe and there is no evidence for any bleeding.  The catheter was then connected to gravity drainage bag.  The patient tolerated the procedure well with no complication and was awoken from anesthesia and taken to recovery in stable condition.     Plan: I will leave the Foley catheter in place for approximately 7 days to allow for urethral healing after urethral dilation.  Follow-up in the office in 1 week for void trial and to review pathology.  Matt R. [facility]  Pager: [phone]

## 2020-06-09 NOTE — H&P (Signed)
Adam Snow  MRN: [id number]  DOB: 1976-06-04, 44 year old Male  SSN:    PRIMARY CARE:    REFERRING:    PROVIDER:  Rexene Alberts, M.D.  LOCATION:  [facility] [phone]     --------------------------------------------------------------------------------   CC/HPI: Mr. Adam Snow is a 44 year old male seen in follow-up today with gross hematuria.   His urine culture last visit on 05/08/2020 demonstrated greater than 100,000 E. coli which was sensitive to Bactrim. He denies dysuria. His UA is negative today.   The episode of hematuria began after a colonoscopy approximately a week and a half ago. He has now 3 times nocturia. He has a fair flow stream. Prior to this he denied any significant lower urinary tract symptoms.   Patient currently denies fever, chills, sweats, nausea, vomiting, abdominal or flank pain. He denies a personal history of malignancy.   He denies a family history of urologic malignancy. His father did die of liver and lung cancer. He is a smoker and smokes a half a pack a day for several years. His PSA on 9/15 was normal at 3.28.     ALLERGIES: NKDA    MEDICATIONS: Adult One Daily Gummies  Ibuprofen     GU PSH: None     PSH Notes: Straighten bones in toes - 2000, 2010  Knee Cap Replacement - 1991  Back Surgery L5 - 2018   NON-GU PSH: Colonoscopy - 05/08/2020     GU PMH: Acute Cystitis/UTI - 05/10/2020 Encounter for Prostate Cancer screening - 05/10/2020 Gross hematuria - 05/10/2020    NON-GU PMH: None   FAMILY HISTORY: 1 Daughter - Runs in Family 3 Son's - Runs in Family   SOCIAL HISTORY: Marital Status: Married Ethnicity: Not Hispanic Or Latino; Race: Black or African American Current Smoking Status: Patient smokes. Has smoked since 04/26/1996. Smokes less than 1/2 pack per day.   Tobacco Use Assessment Completed: Used Tobacco in last 30 days? Has never drank.  Drinks 4+ caffeinated drinks per day.    REVIEW OF SYSTEMS:     GU Review Male:   Patient denies penile pain, have to strain to urinate , erection problems, leakage of urine, burning/ pain with urination, hard to postpone urination, stream starts and stops, trouble starting your stream, get up at night to urinate, and frequent urination.  Gastrointestinal (Upper):   Patient denies nausea, vomiting, and indigestion/ heartburn.  Gastrointestinal (Lower):   Patient denies diarrhea and constipation.  Constitutional:   Patient denies fever, night sweats, weight loss, and fatigue.  Skin:   Patient denies skin rash/ lesion and itching.  Eyes:   Patient denies blurred vision and double vision.  Ears/ Nose/ Throat:   Patient denies sore throat and sinus problems.  Hematologic/Lymphatic:   Patient denies swollen glands and easy bruising.  Cardiovascular:   Patient denies leg swelling and chest pains.  Respiratory:   Patient denies cough and shortness of breath.  Endocrine:   Patient denies excessive thirst.  Musculoskeletal:   Patient denies back pain and joint pain.  Neurological:   Patient denies headaches and dizziness.  Psychologic:   Patient denies depression and anxiety.   VITAL SIGNS:      05/18/2020 12:56 PM  Weight 220 lb / 99.79 kg  Height 72 in / 182.88 cm  BP 123/78 mmHg  Pulse 55 /min  Temperature 98.0 F / 36.6 C  BMI 29.8 kg/m   GU PHYSICAL EXAMINATION:    Scrotum: No lesions. No edema. No cysts.  No warts.  Epididymides: Right: no spermatocele, no masses, no cysts, no tenderness, no induration, no enlargement. Left: no spermatocele, no masses, no cysts, no tenderness, no induration, no enlargement.  Testes: No tenderness, no swelling, no enlargement left testes. No tenderness, no swelling, no enlargement right testes. Normal location left testes. Normal location right testes. No mass, no cyst, no varicocele, no hydrocele left testes. No mass, no cyst, no varicocele, no hydrocele right testes.  Urethral Meatus: Normal size. No lesion, no wart,  no discharge, no polyp. Normal location.  Penis: Circumcised, no warts, no cracks. No dorsal Peyronie's plaques, no left corporal Peyronie's plaques, no right corporal Peyronie's plaques, no scarring, no warts. No balanitis, no meatal stenosis.   MULTI-SYSTEM PHYSICAL EXAMINATION:    Constitutional: Well-nourished. No physical deformities. Normally developed. Good grooming.  Respiratory: No labored breathing, no use of accessory muscles.   Cardiovascular: Normal temperature, normal extremity pulses, no swelling, no varicosities.  Gastrointestinal: No mass, no tenderness, no rigidity, non obese abdomen.     Complexity of Data:  Source Of History:  Patient, Medical Record Summary  Records Review:   AUA Symptom Score, Previous Doctor Records, Previous Patient Records  Urine Test Review:   Urinalysis  X-Ray Review: C.T. Abdomen/Pelvis: Reviewed Films. Reviewed Report.     05/10/20  PSA  Total PSA 3.28 ng/mL    PROCEDURES:         Flexible Cystoscopy - 52000  Risks, benefits, and some of the potential complications of the procedure were discussed at length with the patient including infection, bleeding, voiding discomfort, urinary retention, fever, chills, sepsis, and others. All questions were answered. Informed consent was obtained. Antibiotic prophylaxis was given. Sterile technique and intraurethral analgesia were used.  Meatus:  Normal size. Normal location. Normal condition.  Urethra:  Wide caliber urethral stenosis in bulbar urethra, passable with 17Fr cystoscope  External Sphincter:  Normal.  Verumontanum:  Normal.  Prostate:  Bilobar hyperplasia with median lobe, moderately obstructing.  Bladder Neck:  Non-obstructing.  Ureteral Orifices:  Normal location. Normal size. Normal shape. Effluxed clear urine.  Bladder:  No trabeculation. Erythematous lesions throughout posterior, b/l walls and trigone suspicious for CIS      The lower urinary tract was carefully examined. The  procedure was well-tolerated and without complications. Antibiotic instructions were given. Instructions were given to call the office immediately for bloody urine, difficulty urinating, urinary retention, painful or frequent urination, fever, chills, nausea, vomiting or other illness. The patient stated that he understood these instructions and would comply with them.         Urinalysis Dipstick Dipstick Cont'd  Color: Yellow Bilirubin: Neg mg/dL  Appearance: Clear Ketones: Neg mg/dL  Specific Gravity: 1.025 Blood: Neg ery/uL  pH: 6.0 Protein: Neg mg/dL  Glucose: Neg mg/dL Urobilinogen: 0.2 mg/dL    Nitrites: Neg    Leukocyte Esterase: Neg leu/uL    ASSESSMENT:      ICD-10 Details  1 GU:   Bladder tumor/neoplasm - D41.4   2   Gross hematuria - R31.0   3   Encounter for Prostate Cancer screening - Z12.5    PLAN:           Orders         Schedule X-Rays: Today 05/16/2020 - C.T. Hematuria With and Without I.V. Contrast          Document Letter(s):  Created for Patient: Clinical Summary         Notes:   1. Bladder lesions: Cysto 9/23  with multifocal bladder lesions suspicious for CIS. Will arrange for cysto, TURBT, b/l RPG, intillation of gemcitabine.   2. Gross hematuria: Eval with cysto today as above. Was planned to undergo CTU; however, experienced abdominal pain and had recent CT A/P at hospital with no evidence of GU abnormalities. Will perform b/l RPG with TURBT to eval upper tracts.   3. Prostate cancer screening: DRE benign last visit. PSA normal at 3.28 on 05/10/2020.        Next Appointment:      Next Appointment: 05/18/2020 12:30 PM    Appointment Type: Male Cysto    Location: [facility] [phone]    Provider: Rexene Alberts, M.D.    Reason for Visit: 1-2 WKS CYSTO/CT RESULTS      Signed by Rexene Alberts, M.D. on 05/18/20 at 1:15 PM (EDT)  Urology Preoperative H&P   Chief Complaint: Bladder lesions  History of Present Illness: Adam Snow is a 44 y.o. male with bladder lesions here for TURBT, b/l RPG, instillation of gemcitabine. No fevers or chills.    Past Medical History:  Diagnosis Date  . Bladder mass   . Migraine     Past Surgical History:  Procedure Laterality Date  . FOOT SURGERY Bilateral 1998/2008  . LUMBAR FUSION  2019   L5  . PATELLA FRACTURE SURGERY Right 1989  . WISDOM TOOTH EXTRACTION  1995    Allergies:  Allergies  Allergen Reactions  . Other Hives and Nausea And Vomiting    Squash and okra:     Family History  Problem Relation Age of Onset  . Hyperlipidemia Mother   . Hypertension Mother   . Aneurysm Mother        brain  . Cancer Father        lung primary he thinks and spread to heart and liver  . Asthma Sister   . Alcohol abuse Brother   . Drug abuse Brother   . Colon polyps Neg Hx   . Colon cancer Neg Hx   . Esophageal cancer Neg Hx   . Stomach cancer Neg Hx   . Rectal cancer Neg Hx     Social History:  reports that he has been smoking cigarettes. He has a 10.00 pack-year smoking history. He has never used smokeless tobacco. He reports that he does not drink alcohol and does not use drugs.  ROS: A complete review of systems was performed.  All systems are negative except for pertinent findings as noted.  Physical Exam:  Vital signs in last 24 hours: Temp:  [97.8 F (36.6 C)] 97.8 F (36.6 C) (10/15 0957) Pulse Rate:  [72] 72 (10/15 0957) Resp:  [17] 17 (10/15 0957) BP: (143)/(93) 143/93 (10/15 0957) SpO2:  [98 %] 98 % (10/15 0957) Weight:  [104.3 kg] 104.3 kg (10/15 0957) Constitutional:  Alert and oriented, No acute distress Cardiovascular: Regular rate and rhythm Respiratory: Normal respiratory effort, Lungs clear bilaterally GI: Abdomen is soft, nontender, nondistended, no abdominal masses GU: No CVA tenderness Lymphatic: No lymphadenopathy Neurologic: Grossly intact, no focal deficits Psychiatric: Normal mood and affect  Laboratory Data:  No results  for input(s): WBC, HGB, HCT, PLT in the last 72 hours.  No results for input(s): NA, K, CL, GLUCOSE, BUN, CALCIUM, CREATININE in the last 72 hours.  Invalid input(s): CO3   No results found for this or any previous visit (from the past 24 hour(s)). Recent Results (from the past 240 hour(s))  SARS CORONAVIRUS 2 (TAT  6-24 HRS) Nasopharyngeal Nasopharyngeal Swab     Status: None   Collection Time: 06/06/20  8:48 AM   Specimen: Nasopharyngeal Swab  Result Value Ref Range Status   SARS Coronavirus 2 NEGATIVE NEGATIVE Final    Comment: (NOTE) SARS-CoV-2 target nucleic acids are NOT DETECTED.  The SARS-CoV-2 RNA is generally detectable in upper and lower respiratory specimens during the acute phase of infection. Negative results do not preclude SARS-CoV-2 infection, do not rule out co-infections with other pathogens, and should not be used as the sole basis for treatment or other patient management decisions. Negative results must be combined with clinical observations, patient history, and epidemiological information. The expected result is Negative.  Fact Sheet for Patients: SugarRoll.be  Fact Sheet for Healthcare Providers: https://www.woods-mathews.com/  This test is not yet approved or cleared by the [place] FDA and  has been authorized for detection and/or diagnosis of SARS-CoV-2 by FDA under an Emergency Use Authorization (EUA). This EUA will remain  in effect (meaning this test can be used) for the duration of the COVID-19 declaration under Se ction 564(b)(1) of the Act, 21 U.S.C. section 360bbb-3(b)(1), unless the authorization is terminated or revoked sooner.  Performed at [facility], [place] [address] [place], [place] [address]     Renal Function: No results for input(s): CREATININE in the last 168 hours. CrCl cannot be calculated (Patient's most recent lab result is older than the maximum 21 days  allowed.).  Radiologic Imaging: No results found.  I independently reviewed the above imaging studies.  Assessment and Plan JULE SCHLABACH is a 44 y.o. male with bladder lesions here for TURBT, b/l RPG, instillation of gemcitabine. All risks and benefits discussed. He agrees to proceed.   Matt R. Zola Runion MD 06/09/2020, 11:20 AM  [facility] Pager: [phone]

## 2020-06-09 NOTE — Anesthesia Postprocedure Evaluation (Signed)
Anesthesia Post Note  Patient: Adam Snow  Procedure(s) Performed: CYSTOSCOPY WITH BLADDER BIOPSIES/BILATERAL RETROGRADE PYELOGRAM/ dilation of urethral stricture (N/A )     Patient location during evaluation: PACU Anesthesia Type: General Level of consciousness: awake and alert Pain management: pain level controlled Vital Signs Assessment: post-procedure vital signs reviewed and stable Respiratory status: spontaneous breathing, nonlabored ventilation and respiratory function stable Cardiovascular status: blood pressure returned to baseline and stable Postop Assessment: no apparent nausea or vomiting Anesthetic complications: no   No complications documented.  Last Vitals:  Vitals:   06/09/20 1400 06/09/20 1415  BP: 119/87 118/90  Pulse: 63 64  Resp: 13 15  Temp:    SpO2: 98% 97%    Last Pain:  Vitals:   06/09/20 1415  TempSrc:   PainSc: 8                  Lidia Collum

## 2020-06-09 NOTE — Transfer of Care (Signed)
Immediate Anesthesia Transfer of Care Note  Patient: Adam Snow  Procedure(s) Performed: CYSTOSCOPY WITH BLADDER BIOPSIES/BILATERAL RETROGRADE PYELOGRAM/ dilation of urethral stricture (N/A )  Patient Location: PACU  Anesthesia Type:General  Level of Consciousness: drowsy  Airway & Oxygen Therapy: Patient Spontanous Breathing and Patient connected to face mask oxygen  Post-op Assessment: Report given to RN and Post -op Vital signs reviewed and stable  Post vital signs: Reviewed and stable  Last Vitals:  Vitals Value Taken Time  BP 118/90 06/09/20 1415  Temp 36.4 C 06/09/20 1330  Pulse 58 06/09/20 1416  Resp 16 06/09/20 1416  SpO2 97 % 06/09/20 1416  Vitals shown include unvalidated device data.  Last Pain:  Vitals:   06/09/20 1415  TempSrc:   PainSc: 8       Patients Stated Pain Goal: 4 (16/10/96 0454)  Complications: No complications documented.

## 2020-06-09 NOTE — Anesthesia Procedure Notes (Signed)
Procedure Name: LMA Insertion Date/Time: 06/09/2020 12:13 PM Performed by: Bufford Spikes, CRNA Pre-anesthesia Checklist: Patient identified, Emergency Drugs available, Suction available and Patient being monitored Patient Re-evaluated:Patient Re-evaluated prior to induction Oxygen Delivery Method: Circle system utilized Preoxygenation: Pre-oxygenation with 100% oxygen Induction Type: IV induction Ventilation: Mask ventilation without difficulty LMA: LMA inserted LMA Size: 4.0 Number of attempts: 1 Placement Confirmation: positive ETCO2 Tube secured with: Tape Dental Injury: Teeth and Oropharynx as per pre-operative assessment

## 2020-06-09 NOTE — Anesthesia Preprocedure Evaluation (Addendum)
Anesthesia Evaluation  Patient identified by MRN, date of birth, ID band Patient awake    Reviewed: Allergy & Precautions, NPO status , Patient's Chart, lab work & pertinent test results  History of Anesthesia Complications Negative for: history of anesthetic complications  Airway Mallampati: II  TM Distance: >3 FB Neck ROM: Full    Dental  (+) Teeth Intact   Pulmonary Current Smoker,    Pulmonary exam normal        Cardiovascular negative cardio ROS Normal cardiovascular exam     Neuro/Psych negative neurological ROS  negative psych ROS   GI/Hepatic negative GI ROS, Neg liver ROS,   Endo/Other  negative endocrine ROS  Renal/GU negative Renal ROS   Multifocal bladder mass    Musculoskeletal negative musculoskeletal ROS (+)   Abdominal   Peds  Hematology negative hematology ROS (+)   Anesthesia Other Findings   Reproductive/Obstetrics                            Anesthesia Physical Anesthesia Plan  ASA: II  Anesthesia Plan: General   Post-op Pain Management:    Induction: Intravenous  PONV Risk Score and Plan: 2 and Ondansetron, Dexamethasone, Midazolam and Treatment may vary due to age or medical condition  Airway Management Planned: LMA  Additional Equipment: None  Intra-op Plan:   Post-operative Plan: Extubation in OR  Informed Consent: I have reviewed the patients History and Physical, chart, labs and discussed the procedure including the risks, benefits and alternatives for the proposed anesthesia with the patient or authorized representative who has indicated his/her understanding and acceptance.     Dental advisory given  Plan Discussed with:   Anesthesia Plan Comments:       Anesthesia Quick Evaluation

## 2020-06-12 ENCOUNTER — Encounter (HOSPITAL_BASED_OUTPATIENT_CLINIC_OR_DEPARTMENT_OTHER): Payer: Self-pay | Admitting: Urology

## 2020-06-12 LAB — SURGICAL PATHOLOGY

## 2020-06-12 LAB — CYTOLOGY - NON PAP

## 2020-06-12 NOTE — Addendum Note (Signed)
Addendum  created 06/12/20 0711 by Rogers Blocker, CRNA   Charge Capture section accepted

## 2020-06-16 DIAGNOSIS — R310 Gross hematuria: Secondary | ICD-10-CM | POA: Diagnosis not present

## 2020-06-16 DIAGNOSIS — R350 Frequency of micturition: Secondary | ICD-10-CM | POA: Diagnosis not present

## 2020-06-16 DIAGNOSIS — N401 Enlarged prostate with lower urinary tract symptoms: Secondary | ICD-10-CM | POA: Diagnosis not present

## 2020-06-16 DIAGNOSIS — N35914 Unspecified anterior urethral stricture, male: Secondary | ICD-10-CM | POA: Diagnosis not present

## 2020-06-16 DIAGNOSIS — Z125 Encounter for screening for malignant neoplasm of prostate: Secondary | ICD-10-CM | POA: Diagnosis not present

## 2020-09-14 DIAGNOSIS — R350 Frequency of micturition: Secondary | ICD-10-CM | POA: Diagnosis not present

## 2020-09-14 DIAGNOSIS — R310 Gross hematuria: Secondary | ICD-10-CM | POA: Diagnosis not present

## 2020-09-14 DIAGNOSIS — N401 Enlarged prostate with lower urinary tract symptoms: Secondary | ICD-10-CM | POA: Diagnosis not present

## 2020-09-14 DIAGNOSIS — N471 Phimosis: Secondary | ICD-10-CM | POA: Diagnosis not present

## 2020-09-29 ENCOUNTER — Other Ambulatory Visit: Payer: Self-pay | Admitting: Urology

## 2020-10-25 ENCOUNTER — Encounter (HOSPITAL_BASED_OUTPATIENT_CLINIC_OR_DEPARTMENT_OTHER): Payer: Self-pay | Admitting: Urology

## 2020-10-25 ENCOUNTER — Other Ambulatory Visit: Payer: Self-pay

## 2020-10-25 NOTE — Progress Notes (Signed)
Spoke w/ via phone for pre-op interview---pt Lab needs dos---- none              Lab results------none COVID test ------10-27-2020 1045 Arrive at -------1000 am 10-30-2020 NPO after MN NO Solid Food.  Clear liquids from MN until---900 am then npo Medications to take morning of surgery -----gabapentin prn Diabetic medication -----n/a Patient Special Instructions -----none Pre-Op special Istructions -----none Patient verbalized understanding of instructions that were given at this phone interview. Patient denies shortness of breath, chest pain, fever, cough at this phone interview.  Use pt cell for best contact number [phone]

## 2020-10-27 ENCOUNTER — Other Ambulatory Visit (HOSPITAL_COMMUNITY)
Admission: RE | Admit: 2020-10-27 | Discharge: 2020-10-27 | Disposition: A | Discharge status: Home / Self Care | Payer: Medicaid Other | Source: Ambulatory Visit | Attending: Urology | Admitting: Urology

## 2020-10-27 DIAGNOSIS — Z20822 Contact with and (suspected) exposure to covid-19: Secondary | ICD-10-CM | POA: Diagnosis not present

## 2020-10-27 DIAGNOSIS — Z01812 Encounter for preprocedural laboratory examination: Secondary | ICD-10-CM | POA: Diagnosis not present

## 2020-10-27 LAB — SARS CORONAVIRUS 2 (TAT 6-24 HRS): SARS Coronavirus 2: NEGATIVE

## 2020-10-30 ENCOUNTER — Ambulatory Visit (HOSPITAL_BASED_OUTPATIENT_CLINIC_OR_DEPARTMENT_OTHER): Payer: Medicaid Other | Admitting: Anesthesiology

## 2020-10-30 ENCOUNTER — Encounter (HOSPITAL_BASED_OUTPATIENT_CLINIC_OR_DEPARTMENT_OTHER): Payer: Self-pay | Admitting: Urology

## 2020-10-30 ENCOUNTER — Encounter (HOSPITAL_BASED_OUTPATIENT_CLINIC_OR_DEPARTMENT_OTHER)
Admission: RE | Disposition: A | Discharge status: Home / Self Care | Payer: Self-pay | Source: Home / Self Care | Attending: Urology

## 2020-10-30 ENCOUNTER — Ambulatory Visit (HOSPITAL_BASED_OUTPATIENT_CLINIC_OR_DEPARTMENT_OTHER)
Admission: RE | Admit: 2020-10-30 | Discharge: 2020-10-30 | Disposition: A | Discharge status: Home / Self Care | Payer: Medicaid Other | Attending: Urology | Admitting: Urology

## 2020-10-30 DIAGNOSIS — N471 Phimosis: Secondary | ICD-10-CM | POA: Insufficient documentation

## 2020-10-30 DIAGNOSIS — G43909 Migraine, unspecified, not intractable, without status migrainosus: Secondary | ICD-10-CM | POA: Diagnosis not present

## 2020-10-30 HISTORY — PX: CIRCUMCISION: SHX1350

## 2020-10-30 HISTORY — DX: Presence of spectacles and contact lenses: Z97.3

## 2020-10-30 HISTORY — DX: Phimosis: N47.1

## 2020-10-30 HISTORY — DX: Other chronic pain: G89.29

## 2020-10-30 SURGERY — CIRCUMCISION, ADULT
Anesthesia: General

## 2020-10-30 MED ORDER — OXYCODONE HCL 5 MG/5ML PO SOLN: 5.0000 mg | Freq: Once | ORAL | Status: DC | PRN

## 2020-10-30 MED ORDER — DEXAMETHASONE SODIUM PHOSPHATE 4 MG/ML IJ SOLN
INTRAMUSCULAR | Status: DC | PRN
  Administered 2020-10-30: 10 mg via INTRAVENOUS

## 2020-10-30 MED ORDER — KETOROLAC TROMETHAMINE 30 MG/ML IJ SOLN
INTRAMUSCULAR | Status: DC | PRN
  Administered 2020-10-30: 30 mg via INTRAVENOUS

## 2020-10-30 MED ORDER — MIDAZOLAM HCL 2 MG/2ML IJ SOLN
INTRAMUSCULAR | Status: AC
  Filled 2020-10-30: qty 2

## 2020-10-30 MED ORDER — LIDOCAINE 2% (20 MG/ML) 5 ML SYRINGE
INTRAMUSCULAR | Status: AC
  Filled 2020-10-30: qty 5

## 2020-10-30 MED ORDER — ONDANSETRON HCL 4 MG/2ML IJ SOLN
INTRAMUSCULAR | Status: AC
  Filled 2020-10-30: qty 2

## 2020-10-30 MED ORDER — PROMETHAZINE HCL 25 MG/ML IJ SOLN: 6.2500 mg | INTRAMUSCULAR | Status: DC | PRN

## 2020-10-30 MED ORDER — MIDAZOLAM HCL 5 MG/5ML IJ SOLN
INTRAMUSCULAR | Status: DC | PRN
  Administered 2020-10-30: 2 mg via INTRAVENOUS

## 2020-10-30 MED ORDER — OXYCODONE HCL 5 MG PO TABS: 5.0000 mg | ORAL_TABLET | Freq: Once | ORAL | Status: DC | PRN

## 2020-10-30 MED ORDER — FENTANYL CITRATE (PF) 100 MCG/2ML IJ SOLN
INTRAMUSCULAR | Status: DC | PRN
  Administered 2020-10-30 (×4): 50 ug via INTRAVENOUS

## 2020-10-30 MED ORDER — HYDROMORPHONE HCL 1 MG/ML IJ SOLN
0.2500 mg | INTRAMUSCULAR | Status: DC | PRN
Start: 2020-10-30 — End: 2020-10-30

## 2020-10-30 MED ORDER — LACTATED RINGERS IV SOLN: INTRAVENOUS | Status: DC

## 2020-10-30 MED ORDER — PROPOFOL 10 MG/ML IV BOLUS
INTRAVENOUS | Status: AC
  Filled 2020-10-30: qty 20

## 2020-10-30 MED ORDER — ONDANSETRON HCL 4 MG/2ML IJ SOLN
INTRAMUSCULAR | Status: DC | PRN
  Administered 2020-10-30: 4 mg via INTRAVENOUS

## 2020-10-30 MED ORDER — DOCUSATE SODIUM 100 MG PO CAPS: 100.0000 mg | ORAL_CAPSULE | Freq: Every day | ORAL | 0 refills | Status: AC | PRN

## 2020-10-30 MED ORDER — OXYCODONE-ACETAMINOPHEN 5-325 MG PO TABS: 1.0000 | ORAL_TABLET | ORAL | 0 refills | Status: DC | PRN

## 2020-10-30 MED ORDER — FENTANYL CITRATE (PF) 100 MCG/2ML IJ SOLN
INTRAMUSCULAR | Status: AC
  Filled 2020-10-30: qty 2

## 2020-10-30 MED ORDER — CEFAZOLIN SODIUM-DEXTROSE 2-4 GM/100ML-% IV SOLN
INTRAVENOUS | Status: AC
  Filled 2020-10-30: qty 100

## 2020-10-30 MED ORDER — DEXAMETHASONE SODIUM PHOSPHATE 10 MG/ML IJ SOLN
INTRAMUSCULAR | Status: AC
  Filled 2020-10-30: qty 1

## 2020-10-30 MED ORDER — PROPOFOL 10 MG/ML IV BOLUS
INTRAVENOUS | Status: DC | PRN
  Administered 2020-10-30: 50 mg via INTRAVENOUS
  Administered 2020-10-30: 250 mg via INTRAVENOUS
  Administered 2020-10-30: 50 mg via INTRAVENOUS

## 2020-10-30 MED ORDER — KETOROLAC TROMETHAMINE 30 MG/ML IJ SOLN
INTRAMUSCULAR | Status: AC
  Filled 2020-10-30: qty 1

## 2020-10-30 MED ORDER — LIDOCAINE HCL (CARDIAC) PF 100 MG/5ML IV SOSY
PREFILLED_SYRINGE | INTRAVENOUS | Status: DC | PRN
  Administered 2020-10-30: 100 mg via INTRAVENOUS

## 2020-10-30 MED ORDER — MEPERIDINE HCL 25 MG/ML IJ SOLN: 6.2500 mg | INTRAMUSCULAR | Status: DC | PRN

## 2020-10-30 MED ORDER — BUPIVACAINE HCL 0.5 % IJ SOLN
INTRAMUSCULAR | Status: DC | PRN
  Administered 2020-10-30: 10 mL

## 2020-10-30 MED ORDER — CEFAZOLIN SODIUM-DEXTROSE 2-4 GM/100ML-% IV SOLN
2.0000 g | Freq: Once | INTRAVENOUS | Status: AC
  Administered 2020-10-30: 2 g via INTRAVENOUS

## 2020-10-30 SURGICAL SUPPLY — 36 items
BLADE SURG 15 STRL LF DISP TIS (BLADE) ×1 IMPLANT
BLADE SURG 15 STRL SS (BLADE) ×2
BNDG COHESIVE 1X5 TAN NS LF (GAUZE/BANDAGES/DRESSINGS) ×1 IMPLANT
BNDG COHESIVE 1X5 TAN STRL LF (GAUZE/BANDAGES/DRESSINGS) ×1 IMPLANT
BNDG CONFORM 2 STRL LF (GAUZE/BANDAGES/DRESSINGS) ×2 IMPLANT
CORD BIPOLAR FORCEPS 12FT (ELECTRODE) ×2 IMPLANT
COVER BACK TABLE 60X90IN (DRAPES) ×2 IMPLANT
COVER MAYO STAND STRL (DRAPES) ×2 IMPLANT
COVER WAND RF STERILE (DRAPES) ×2 IMPLANT
DECANTER SPIKE VIAL GLASS SM (MISCELLANEOUS) IMPLANT
DRAPE LAPAROTOMY 100X72 PEDS (DRAPES) ×2 IMPLANT
DRSG TEGADERM 4X4.75 (GAUZE/BANDAGES/DRESSINGS) ×1 IMPLANT
ELECT NDL TIP 2.8 STRL (NEEDLE) ×1 IMPLANT
ELECT NEEDLE TIP 2.8 STRL (NEEDLE) ×2 IMPLANT
ELECT REM PT RETURN 9FT ADLT (ELECTROSURGICAL) ×2
ELECTRODE REM PT RTRN 9FT ADLT (ELECTROSURGICAL) ×1 IMPLANT
GAUZE PETROLATUM 1 X8 (GAUZE/BANDAGES/DRESSINGS) ×1 IMPLANT
GAUZE XEROFORM 1X8 LF (GAUZE/BANDAGES/DRESSINGS) ×2 IMPLANT
GLOVE SURG ENC MOIS LTX SZ7 (GLOVE) ×2 IMPLANT
GLOVE SURG UNDER POLY LF SZ7.5 (GLOVE) ×2 IMPLANT
GOWN STRL REUS W/TWL LRG LVL3 (GOWN DISPOSABLE) ×2 IMPLANT
KIT TURNOVER CYSTO (KITS) ×2 IMPLANT
NDL HYPO 25X1 1.5 SAFETY (NEEDLE) IMPLANT
NEEDLE HYPO 25X1 1.5 SAFETY (NEEDLE) IMPLANT
NS IRRIG 500ML POUR BTL (IV SOLUTION) IMPLANT
PACK BASIN DAY SURGERY FS (CUSTOM PROCEDURE TRAY) ×2 IMPLANT
PENCIL SMOKE EVACUATOR (MISCELLANEOUS) ×2 IMPLANT
SUT CHROMIC 3 0 SH 27 (SUTURE) ×4 IMPLANT
SUT SILK 2 0 SH (SUTURE) IMPLANT
SUT VIC AB 4-0 PS2 18 (SUTURE) ×3 IMPLANT
SUT VIC AB 4-0 SH 27 (SUTURE) ×4
SUT VIC AB 4-0 SH 27XANBCTRL (SUTURE) IMPLANT
SYR CONTROL 10ML LL (SYRINGE) IMPLANT
TOWEL OR 17X26 10 PK STRL BLUE (TOWEL DISPOSABLE) ×2 IMPLANT
TRAY DSU PREP LF (CUSTOM PROCEDURE TRAY) ×2 IMPLANT
WATER STERILE IRR 500ML POUR (IV SOLUTION) IMPLANT

## 2020-10-30 NOTE — Transfer of Care (Signed)
Immediate Anesthesia Transfer of Care Note  Patient: Adam Snow  Procedure(s) Performed: Procedure(s) (LRB): CIRCUMCISION ADULT (N/A)  Patient Location: PACU  Anesthesia Type: General  Level of Consciousness: awake, sedated, patient cooperative and responds to stimulation  Airway & Oxygen Therapy: Patient Spontanous Breathing and Patient connected to [place] 02 and soft FM   Post-op Assessment: Report given to PACU RN, Post -op Vital signs reviewed and stable and Patient moving all extremities  Post vital signs: Reviewed and stable  Complications: No apparent anesthesia complications

## 2020-10-30 NOTE — Op Note (Signed)
Operative Note  Preoperative diagnosis:  1.  Phimosis  Postoperative diagnosis: 1.  Phimosis  Procedure(s): 1.  Circumcision with frenuloplasty 2. Dorsal penile nerve block  Surgeon: Rexene Alberts, MD  Assistants:  None  Anesthesia:  General  Complications:  None  EBL:  <50ml  Specimens: None  Drains/Catheters: 1.  none  Intraoperative findings:   1. Phimosis 2. Uncomplicated circumcision  Indication:  Adam Snow is a 45 y.o. male who presented with phimosis and after being counseled with options he elected to have circumcision.   Description of procedure: The patient was brought back to the operating room, placed on table in a supine position.  He had bilateral sequential compression devices placed and received a dose of Ancef 2 gr preoperatively for antibiotic prophylaxis.  He was placed under general LM anesthesia and then standard prep was performed.  He was prepped and draped in standard sterile fashion.  Surgical time-out was performed identifying the correct patient, procedure, and all were in agreement.   A dorsal penile nerve block was given with .25% Marcaine plain, a total of 68mL used. The prepuce was retracted and a subcoronal incision made at 1 cm from sulcus. Hemostasis was obtained with bipolar. The frenulum was reapproximated with a running and interrupted 4-0 vicryl. Hemostats were used to provide traction on the redundant prepuce and a dorsal slit was made with mets the incise the appropriate length of skin for removal. An anchoring interrupted 4-0 vicryl was placed reapproximate the skin, leaving the tail long to provide traction. Scissors were used to make a ventral slit and another vicryl was placed at this location. An incision was made on the skin between these two vicryls. The tissue was then removed with cautery to control any blood vessels at the dartos fascia. Procedure was repeated on the other side, incising the skin and removing with  cautery. Hemostasis was intact. Interrupted 4-0 vicryls were placed in interrupted fashion. Additional vicryls were placed at the frenulum for hemostasis. Satisfied with the cosmesis, we finalized the case with the dressing; Xerform, covered with gauze and a very loose Coban dressing.  Plan:  The patient was awoken from general anesthesia, transferred to the PACU in stable condition.  He will be recovered in the PACU and then discharged.  [facility]  Pager: [phone]

## 2020-10-30 NOTE — Anesthesia Preprocedure Evaluation (Signed)
Anesthesia Evaluation  Patient identified by MRN, date of birth, ID band Patient awake    Reviewed: Allergy & Precautions, NPO status , Patient's Chart, lab work & pertinent test results  History of Anesthesia Complications Negative for: history of anesthetic complications  Airway Mallampati: II  TM Distance: >3 FB Neck ROM: Full    Dental  (+) Teeth Intact, Dental Advisory Given   Pulmonary Current Smoker,    Pulmonary exam normal        Cardiovascular negative cardio ROS Normal cardiovascular exam Rhythm:Regular Rate:Normal     Neuro/Psych  Headaches, negative psych ROS   GI/Hepatic negative GI ROS, Neg liver ROS,   Endo/Other  negative endocrine ROS  Renal/GU negative Renal ROS   Multifocal bladder mass    Musculoskeletal negative musculoskeletal ROS (+)   Abdominal   Peds  Hematology negative hematology ROS (+)   Anesthesia Other Findings   Reproductive/Obstetrics                             Anesthesia Physical  Anesthesia Plan  ASA: II  Anesthesia Plan: General   Post-op Pain Management:    Induction: Intravenous  PONV Risk Score and Plan: 2 and Ondansetron, Dexamethasone, Midazolam and Treatment may vary due to age or medical condition  Airway Management Planned: LMA  Additional Equipment: None  Intra-op Plan:   Post-operative Plan: Extubation in OR  Informed Consent: I have reviewed the patients History and Physical, chart, labs and discussed the procedure including the risks, benefits and alternatives for the proposed anesthesia with the patient or authorized representative who has indicated his/her understanding and acceptance.     Dental advisory given  Plan Discussed with: CRNA  Anesthesia Plan Comments:         Anesthesia Quick Evaluation

## 2020-10-30 NOTE — H&P (Signed)
Office Visit Report     09/14/2020   --------------------------------------------------------------------------------   Adam Snow  MRN: [id number]  DOB: 06-Aug-1976, 45 year old Male  SSN:    PRIMARY CARE:    REFERRING:  Janith Lima, MD  PROVIDER:  Rexene Alberts, M.D.  LOCATION:  [facility] [phone]     --------------------------------------------------------------------------------   CC/HPI: Adam Snow is a 45 year old male seen in follow-up with a history of gross hematuria.   He initially presented with gross hematuria. CT A/P obtained in the hospital for other reasons this fall demonstrated no evidence of GU abnormalities. Cystoscopy on 9/23 with erythematous lesions in the posterior aspect the bladder. He is status post cystoscopy, bladder biopsy 06/09/2020 that resulted benign urothelium. Bladder washings were also negative for high-grade urothelial carcinoma. Bilateral retrograde pyelograms demonstrated no filling defects.   Of note during the case, he was found to have a wide caliber bulbar urethral stricture. There is a flap of tissue that tore during passage of the rigid scope. This was then dilated and a Foley cath was left in place for 7 days. He passed a void trial. PVR today 09/14/2020 0. He reports strong flow of stream. He denies difficulties urinating.   He has has phimosis and request circumcision. He states that sometimes it is painful as he is unable to retract his foreskin.     ALLERGIES: NKDA    MEDICATIONS: Adult One Daily Gummies  Ibuprofen     GU PSH: Cysto Bladder Ureth Biopsy - 06/09/2020 Cysto Dilate Stricture (M or F) - 06/09/2020 Cystoscopy - 05/18/2020       PSH Notes: Straighten bones in toes - 2000, 2010  Knee Cap Replacement - 1991  Back Surgery L5 - 2018   NON-GU PSH: Colonoscopy - 05/08/2020     GU PMH: BPH w/LUTS - 06/16/2020 Encounter for Prostate Cancer screening (Stable) - 06/16/2020, -  05/18/2020, - 05/10/2020 Gross hematuria - 06/16/2020, - 05/18/2020, - 05/10/2020 Urinary Frequency - 06/16/2020 Bladder tumor/neoplasm - 05/18/2020 Acute Cystitis/UTI - 05/10/2020    NON-GU PMH: Unspecified anterior urethral stricture, male - 06/16/2020    FAMILY HISTORY: 1 Daughter - Runs in Family 3 Son's - Runs in Family   SOCIAL HISTORY: Marital Status: Married Ethnicity: Not Hispanic Or Latino; Race: Black or African American Current Smoking Status: Patient smokes. Has smoked since 04/26/1996. Smokes less than 1/2 pack per day.   Tobacco Use Assessment Completed: Used Tobacco in last 30 days? Has never drank.  Drinks 4+ caffeinated drinks per day.    REVIEW OF SYSTEMS:    GU Review Male:   Patient denies frequent urination, hard to postpone urination, burning/ pain with urination, get up at night to urinate, leakage of urine, stream starts and stops, trouble starting your stream, have to strain to urinate , erection problems, and penile pain.  Gastrointestinal (Upper):   Patient denies nausea, vomiting, and indigestion/ heartburn.  Gastrointestinal (Lower):   Patient denies diarrhea and constipation.  Constitutional:   Patient denies fever, night sweats, weight loss, and fatigue.  Skin:   Patient denies skin rash/ lesion and itching.  Eyes:   Patient denies blurred vision and double vision.  Ears/ Nose/ Throat:   Patient denies sore throat and sinus problems.  Hematologic/Lymphatic:   Patient denies swollen glands and easy bruising.  Cardiovascular:   Patient denies leg swelling and chest pains.  Respiratory:   Patient denies cough and shortness of breath.  Endocrine:   Patient denies excessive thirst.  Musculoskeletal:   Patient denies back pain and joint pain.  Neurological:   Patient denies dizziness and headaches.  Psychologic:   Patient denies depression and anxiety.   VITAL SIGNS:      09/14/2020 02:49 PM  Weight 220 lb / 99.79 kg  Height 72 in / 182.88 cm  BP 142/86  mmHg  Pulse 74 /min  Temperature 97.7 F / 36.5 C  BMI 29.8 kg/m   GU PHYSICAL EXAMINATION:    Penis: Uncircumcised, able to retract foreskin   MULTI-SYSTEM PHYSICAL EXAMINATION:    Constitutional: Well-nourished. No physical deformities. Normally developed. Good grooming.  Respiratory: No labored breathing, no use of accessory muscles.   Cardiovascular: Normal temperature, normal extremity pulses, no swelling, no varicosities.  Gastrointestinal: No mass, no tenderness, no rigidity, non obese abdomen.     Complexity of Data:  Source Of History:  Patient, Medical Record Summary  Lab Test Review:   PSA  Records Review:   AUA Symptom Score, Pathology Reports, Previous Doctor Records, Previous Hospital Records  Urine Test Review:   Urinalysis  Urodynamics Review:   Review Bladder Scan   05/10/20  PSA  Total PSA 3.28 ng/mL    PROCEDURES:         PVR Ultrasound - 10175  Scanned Volume: 0 cc         Urinalysis Dipstick Dipstick Cont'd  Color: Yellow Bilirubin: Neg mg/dL  Appearance: Clear Ketones: Neg mg/dL  Specific Gravity: 1.025 Blood: Neg ery/uL  pH: <=5.0 Protein: Neg mg/dL  Glucose: Neg mg/dL Urobilinogen: 0.2 mg/dL    Nitrites: Neg    Leukocyte Esterase: Neg leu/uL    ASSESSMENT:      ICD-10 Details  1 GU:   BPH w/LUTS - N40.1   2   Gross hematuria - R31.0   3   Phimosis - N47.1    PLAN:           Schedule Return Visit/Planned Activity: 1 Year - Office Visit          Document Letter(s):  Created for Patient: Clinical Summary         Notes:   1. Gross hematuria: Negative valuation 05/2020. CT A/P in 04/2020 with no evidence for disease. Cystoscopy, bladder biopsy, bilateral retrograde pyelograms on 06/09/2020 with no evidence for malignancy, and no filling defects. Pathology resulted benign. Hematuria possibly due to enlarged prostate. Return to clinic in 1 year with urinalysis.   2. Bulbar urethral stricture: Identified initially on cystoscopy in the office  on 05/18/2020 with wide caliber stenosis. This is dilated in the OR on 06/09/2020. Voiding without difficulty. PVR 09/14/2020 is 0.   3. Prostate cancer screening: DRE benign. PSA was normal at 3.28 on 05/10/2020.   4. Phimosis: We will proceed with circumcision. Discussed all risk and benefits. He elects to proceed. Surgery letter sent.    Signed by Rexene Alberts, M.D. on 09/15/20 at 4:35 PM (EST)  Urology Preoperative H&P   Chief Complaint: phimosis  History of Present Illness: YOUNIS MATHEY is a 45 y.o. male with phimosis here for circumcision. Doing well without fevers, chills.    Past Medical History:  Diagnosis Date  . Bladder mass   . Chronic back pain since 2018   work injury  . Migraine   . Phimosis   . Wears glasses     Past Surgical History:  Procedure Laterality Date  . FOOT SURGERY Bilateral 1998/2008  . LUMBAR FUSION  2019   L5  . PATELLA FRACTURE  SURGERY Right 1989  . TRANSURETHRAL RESECTION OF BLADDER TUMOR N/A 06/09/2020   Procedure: CYSTOSCOPY WITH BLADDER BIOPSIES/BILATERAL RETROGRADE PYELOGRAM/ dilation of urethral stricture;  Surgeon: Janith Lima, MD;  Location: [facility];  Service: Urology;  Laterality: N/A;  . WISDOM TOOTH EXTRACTION  1995    Allergies:  Allergies  Allergen Reactions  . Other Hives and Nausea And Vomiting    Squash and okra:     Family History  Problem Relation Age of Onset  . Hyperlipidemia Mother   . Hypertension Mother   . Aneurysm Mother        brain  . Cancer Father        lung primary he thinks and spread to heart and liver  . Asthma Sister   . Alcohol abuse Brother   . Drug abuse Brother   . Colon polyps Neg Hx   . Colon cancer Neg Hx   . Esophageal cancer Neg Hx   . Stomach cancer Neg Hx   . Rectal cancer Neg Hx     Social History:  reports that he has been smoking cigarettes. He has a 10.00 pack-year smoking history. He has never used smokeless tobacco. He reports that he does not  drink alcohol and does not use drugs.  ROS: A complete review of systems was performed.  All systems are negative except for pertinent findings as noted.  Physical Exam:  Vital signs in last 24 hours: Temp:  [98 F (36.7 C)] 98 F (36.7 C) (03/07 1037) Pulse Rate:  [57] 57 (03/07 1037) Resp:  [16] 16 (03/07 1037) BP: (142)/(93) 142/93 (03/07 1037) SpO2:  [100 %] 100 % (03/07 1037) Weight:  [104.6 kg] 104.6 kg (03/07 1037) Constitutional:  Alert and oriented, No acute distress Cardiovascular: Regular rate and rhythm Respiratory: Normal respiratory effort, Lungs clear bilaterally GI: Abdomen is soft, nontender, nondistended, no abdominal masses GU: No CVA tenderness Lymphatic: No lymphadenopathy Neurologic: Grossly intact, no focal deficits Psychiatric: Normal mood and affect  Laboratory Data:  No results for input(s): WBC, HGB, HCT, PLT in the last 72 hours.  No results for input(s): NA, K, CL, GLUCOSE, BUN, CALCIUM, CREATININE in the last 72 hours.  Invalid input(s): CO3   No results found for this or any previous visit (from the past 24 hour(s)). Recent Results (from the past 240 hour(s))  SARS CORONAVIRUS 2 (TAT 6-24 HRS) Nasopharyngeal Nasopharyngeal Swab     Status: None   Collection Time: 10/27/20 10:39 AM   Specimen: Nasopharyngeal Swab  Result Value Ref Range Status   SARS Coronavirus 2 NEGATIVE NEGATIVE Final    Comment: (NOTE) SARS-CoV-2 target nucleic acids are NOT DETECTED.  The SARS-CoV-2 RNA is generally detectable in upper and lower respiratory specimens during the acute phase of infection. Negative results do not preclude SARS-CoV-2 infection, do not rule out co-infections with other pathogens, and should not be used as the sole basis for treatment or other patient management decisions. Negative results must be combined with clinical observations, patient history, and epidemiological information. The expected result is Negative.  Fact Sheet for  Patients: SugarRoll.be  Fact Sheet for Healthcare Providers: https://www.woods-mathews.com/  This test is not yet approved or cleared by the [place] FDA and  has been authorized for detection and/or diagnosis of SARS-CoV-2 by FDA under an Emergency Use Authorization (EUA). This EUA will remain  in effect (meaning this test can be used) for the duration of the COVID-19 declaration under Se ction 564(b)(1) of the Act,  21 U.S.C. section 360bbb-3(b)(1), unless the authorization is terminated or revoked sooner.  Performed at [facility], [place] [address] [place], [place] [address]     Renal Function: No results for input(s): CREATININE in the last 168 hours. CrCl cannot be calculated (Patient's most recent lab result is older than the maximum 21 days allowed.).  Radiologic Imaging: No results found.  I independently reviewed the above imaging studies.  Assessment and Plan Adam Snow is a 45 y.o. male with phimosis here for circumcision. Ok to proceed.  Matt R. Anais Koenen MD 10/30/2020, 12:07 PM  [facility] Pager: [phone]): [phone]

## 2020-10-30 NOTE — Anesthesia Procedure Notes (Signed)
Procedure Name: LMA Insertion Date/Time: 10/30/2020 12:19 PM Performed by: Justice Rocher, CRNA Pre-anesthesia Checklist: Patient identified, Emergency Drugs available, Suction available, Patient being monitored and Timeout performed Patient Re-evaluated:Patient Re-evaluated prior to induction Oxygen Delivery Method: Circle system utilized Preoxygenation: Pre-oxygenation with 100% oxygen Induction Type: IV induction Ventilation: Mask ventilation without difficulty LMA: LMA inserted LMA Size: 5.0 Number of attempts: 1 Airway Equipment and Method: Bite block Placement Confirmation: positive ETCO2,  breath sounds checked- equal and bilateral and CO2 detector Tube secured with: Tape Dental Injury: Teeth and Oropharynx as per pre-operative assessment

## 2020-10-30 NOTE — Discharge Instructions (Signed)
Activity:  You are encouraged to ambulate frequently (about every hour during waking hours) to help prevent blood clots from forming in your legs or lungs.  However, you should not engage in any heavy lifting (> 10-15 lbs), strenuous activity, or straining.   Diet: You should advance your diet as instructed by your physician.  It will be normal to have some bloating, nausea, and abdominal discomfort intermittently.   Prescriptions:  You will be provided a prescription for pain medication to take as needed.  If your pain is not severe enough to require the prescription pain medication, you may take extra strength Tylenol instead which will have less side effects.  You should also take a prescribed stool softener to avoid straining with bowel movements as the prescription pain medication may constipate you.   Incisions: You may remove your dressing bandages 48 hours after surgery if not removed in the hospital.  You will either have some small staples or special tissue glue at each of the incision sites. Once the bandages are removed (if present), the incisions may stay open to air.  You may start showering (but not soaking or bathing in water) the 2nd day after surgery and the incisions simply need to be patted dry after the shower.  No additional care is needed.   What to call us about: You should call the office [phone]) if you develop fever > 101 or develop persistent vomiting. Activity:  You are encouraged to ambulate frequently (about every hour during waking hours) to help prevent blood clots from forming in your legs or lungs.  However, you should not engage in any heavy lifting (> 10-15 lbs), strenuous activity, or straining.   Post Anesthesia Home Care Instructions  Activity: Get plenty of rest for the remainder of the day. A responsible individual must stay with you for 24 hours following the procedure.  For the next 24 hours, DO NOT: -Drive a car -Paediatric nurse -Drink  alcoholic beverages -Take any medication unless instructed by your physician -Make any legal decisions or sign important papers.  Meals: Start with liquid foods such as gelatin or soup. Progress to regular foods as tolerated. Avoid greasy, spicy, heavy foods. If nausea and/or vomiting occur, drink only clear liquids until the nausea and/or vomiting subsides. Call your physician if vomiting continues.  Special Instructions/Symptoms: Your throat may feel dry or sore from the anesthesia or the breathing tube placed in your throat during surgery. If this causes discomfort, gargle with warm salt water. The discomfort should disappear within 24 hours.     Circumcision-Home Care Instructions  The following instructions have been prepared to help you care for yourself upon your return home today.   Wound Care & Hygiene:   You may apply ice to the penis.  This may help to decrease swelling.  Remove the dressing tomorrow.  If the dressing falls off before then, leave it off.  You may shower or bathe in 48 hours  Gently wash the penis with soap and water.  The stitches do not need to be removed.  Activity:  Do not drive or operate any equipment today.  The effects of anesthesia are still present, drowsiness may result.  Rest today, not necessarily flat bed rest, just take it easy.  You may resume your normal activity in one to two days or as indicated by your physician.  Sexual Activity:  Erection and sexual relations should be avoided for *2 weeks.  Return to Work:  One to  two days or as indicated by your physician.  Diet:  Drink liquids or eat a very light diet this evening.  You may resume a regular diet tomorrow.  General Expectations of your surgery:   You may have a small amount of bleeding  The penis will be swollen and bruised for approximately one week  You may wake during the night with an erection, usually this is caused by having a full bladder so you should try to urinate  (pass your water) to relieve the erection or apply ice to the penis  Unexpected Observations - Call your doctor if these occur!  Persistent or heavy bleeding  Temperature of 101 degrees or more  Severe pain not relieved by medication

## 2020-10-31 ENCOUNTER — Encounter (HOSPITAL_BASED_OUTPATIENT_CLINIC_OR_DEPARTMENT_OTHER): Payer: Self-pay | Admitting: Urology

## 2020-10-31 NOTE — Anesthesia Postprocedure Evaluation (Signed)
Anesthesia Post Note  Patient: Adam Snow  Procedure(s) Performed: CIRCUMCISION ADULT (N/A )     Patient location during evaluation: PACU Anesthesia Type: General Level of consciousness: sedated and patient cooperative Pain management: pain level controlled Vital Signs Assessment: post-procedure vital signs reviewed and stable Respiratory status: spontaneous breathing Cardiovascular status: stable Anesthetic complications: no   No complications documented.  Last Vitals:  Vitals:   10/30/20 1415 10/30/20 1530  BP: 123/83 (!) 129/95  Pulse: (!) 52 (!) 46  Resp: 15 12  Temp:  (!) 36.3 C  SpO2: 98% 99%    Last Pain:  Vitals:   10/30/20 1415  TempSrc:   PainSc: 0-No pain                 Nolon Nations

## 2020-11-14 DIAGNOSIS — N471 Phimosis: Secondary | ICD-10-CM | POA: Diagnosis not present

## 2020-12-13 DIAGNOSIS — N471 Phimosis: Secondary | ICD-10-CM | POA: Diagnosis not present

## 2020-12-13 DIAGNOSIS — Z125 Encounter for screening for malignant neoplasm of prostate: Secondary | ICD-10-CM | POA: Diagnosis not present

## 2021-01-30 DIAGNOSIS — H532 Diplopia: Secondary | ICD-10-CM | POA: Diagnosis not present

## 2021-01-30 DIAGNOSIS — H5022 Vertical strabismus, left eye: Secondary | ICD-10-CM | POA: Diagnosis not present

## 2021-06-19 ENCOUNTER — Other Ambulatory Visit: Payer: Self-pay | Admitting: Family Medicine

## 2021-07-11 DIAGNOSIS — N471 Phimosis: Secondary | ICD-10-CM | POA: Diagnosis not present

## 2021-07-11 DIAGNOSIS — Z125 Encounter for screening for malignant neoplasm of prostate: Secondary | ICD-10-CM | POA: Diagnosis not present

## 2021-07-11 DIAGNOSIS — R310 Gross hematuria: Secondary | ICD-10-CM | POA: Diagnosis not present

## 2022-01-29 ENCOUNTER — Encounter: Payer: Self-pay | Admitting: *Deleted

## 2022-04-01 ENCOUNTER — Emergency Department (HOSPITAL_BASED_OUTPATIENT_CLINIC_OR_DEPARTMENT_OTHER): Payer: Medicaid Other

## 2022-04-01 ENCOUNTER — Emergency Department (HOSPITAL_COMMUNITY)
Admission: EM | Admit: 2022-04-01 | Discharge: 2022-04-01 | Disposition: A | Discharge status: Home / Self Care | Payer: Medicaid Other | Attending: Emergency Medicine | Admitting: Emergency Medicine

## 2022-04-01 ENCOUNTER — Encounter (HOSPITAL_COMMUNITY): Payer: Self-pay | Admitting: Emergency Medicine

## 2022-04-01 DIAGNOSIS — M79604 Pain in right leg: Secondary | ICD-10-CM | POA: Diagnosis not present

## 2022-04-01 DIAGNOSIS — M79661 Pain in right lower leg: Secondary | ICD-10-CM | POA: Diagnosis not present

## 2022-04-01 MED ORDER — METHOCARBAMOL 500 MG PO TABS: 500.0000 mg | ORAL_TABLET | Freq: Two times a day (BID) | ORAL | 0 refills | Status: DC

## 2022-04-01 MED ORDER — KETOROLAC TROMETHAMINE 60 MG/2ML IM SOLN
60.0000 mg | Freq: Once | INTRAMUSCULAR | Status: AC
  Administered 2022-04-01: 60 mg via INTRAMUSCULAR
  Filled 2022-04-01: qty 2

## 2022-04-01 MED ORDER — NAPROXEN 500 MG PO TABS: 500.0000 mg | ORAL_TABLET | Freq: Two times a day (BID) | ORAL | 0 refills | Status: DC

## 2022-04-01 NOTE — Progress Notes (Signed)
Lower extremity venous duplex has been completed.   Preliminary results in CV Proc.   Magnus Crescenzo Niel Peretti 04/01/2022 11:04 AM

## 2022-04-01 NOTE — ED Triage Notes (Signed)
Patient complains of right lower leg pain that started two weeks ago that is exacerbated by doing any one activity for too long (standing, walking, sitting, stretching). Pain is located posterior to knee and on lateral lower leg. Patient denies shortness of breath, denies chest pain.

## 2022-04-01 NOTE — ED Provider Notes (Signed)
[facility] EMERGENCY DEPARTMENT Provider Note   CSN: [id number] Arrival date & time: 04/01/22  0818     History  Chief Complaint  Patient presents with   Leg Pain    Adam Snow is a 46 y.o. male.  HPI 46 year old male with a history of migraines, chronic back pain, bladder mass, phimosis presents to the ER with complaints of pain behind his right knee which radiates down into his left lower leg which is exacerbated by doing any 1 activity for too long (standing, walking, sitting, stretching).  Denies any recent long trips.  Denies any leg swelling.  Denies any redness.  No history of PE or DVT.  He has been taking ibuprofen with little relief.  Denies any recent injuries or falls.  He does report working out approximately a week prior to his pain starting.    Home Medications Prior to Admission medications   Medication Sig Start Date End Date Taking? Authorizing Provider  methocarbamol (ROBAXIN) 500 MG tablet Take 1 tablet (500 mg total) by mouth 2 (two) times daily. 04/01/22  Yes Garald Balding, PA-C  naproxen (NAPROSYN) 500 MG tablet Take 1 tablet (500 mg total) by mouth 2 (two) times daily for 7 days. 04/01/22 04/08/22 Yes Garald Balding, PA-C  gabapentin (NEURONTIN) 100 MG capsule Take 1 capsule (100 mg total) by mouth 3 (three) times daily. Patient taking differently: Take 100 mg by mouth as needed. 02/22/20   Shirley, [place], DO  ibuprofen (ADVIL) 600 MG tablet Take 600 mg by mouth every 6 (six) hours as needed.    [provider]  naproxen sodium (ALEVE) 220 MG tablet Take by mouth.    [provider]  Olopatadine HCl 0.2 % SOLN as needed.  07/06/19   [provider]  oxyCODONE-acetaminophen (PERCOCET) 5-325 MG tablet Take 1 tablet by mouth every 4 (four) hours as needed for up to 12 doses for severe pain. 10/30/20   Janith Lima, MD      Allergies    Other    Review of Systems   Review of Systems Ten systems reviewed  and are negative for acute change, except as noted in the HPI.   Physical Exam Updated Vital Signs BP (!) 144/103 (BP Location: Right Arm)   Pulse 66   Temp 98.7 F (37.1 C) (Oral)   Resp 16   SpO2 97%  Physical Exam Vitals and nursing note reviewed.  Constitutional:      General: He is not in acute distress.    Appearance: He is well-developed.  HENT:     Head: Normocephalic and atraumatic.  Eyes:     Conjunctiva/sclera: Conjunctivae normal.  Cardiovascular:     Rate and Rhythm: Normal rate and regular rhythm.     Heart sounds: No murmur heard. Pulmonary:     Effort: Pulmonary effort is normal. No respiratory distress.     Breath sounds: Normal breath sounds.  Abdominal:     Palpations: Abdomen is soft.     Tenderness: There is no abdominal tenderness.  Musculoskeletal:        General: Tenderness present. No swelling.     Cervical back: Neck supple.     Comments: Right calf/leg without any evidence of swelling, erythema, warmth.  Compartments are soft.  He does have pain in the popliteal area, no visible masses, drainage, warmth, fluctuance.  2+ DP pulses.  Sensations intact.  Range of motion slightly limited secondary to pain.  No significant lower  extremity edema noted.  Negative Thompson's test  Skin:    General: Skin is warm and dry.     Capillary Refill: Capillary refill takes less than 2 seconds.  Neurological:     Mental Status: He is alert.  Psychiatric:        Mood and Affect: Mood normal.     ED Results / Procedures / Treatments   Labs (all labs ordered are listed, but only abnormal results are displayed) Labs Reviewed - No data to display  EKG None  Radiology VAS [place] LOWER EXTREMITY VENOUS (DVT) (ONLY [facility] & [facility])  Result Date: 04/01/2022  Lower Venous DVT Study Patient Name:  Adam Snow  Date of Exam:   04/01/2022 Medical Rec #: [id number]              Accession #:    [id number] Date of Birth: 1976/04/12              Patient Gender: M Patient Age:    39 years Exam Location:  [facility] [place] Procedure:      VAS [place] LOWER EXTREMITY VENOUS (DVT) Referring Phys: Sharyn Lull --------------------------------------------------------------------------------  Indications: Pain.  Comparison Study: no prior Performing Technologist: Archie Patten RVS  Examination Guidelines: A complete evaluation includes B-mode imaging, spectral Doppler, color Doppler, and power Doppler as needed of all accessible portions of each vessel. Bilateral testing is considered an integral part of a complete examination. Limited examinations for reoccurring indications may be performed as noted. The reflux portion of the exam is performed with the patient in reverse Trendelenburg.  +---------+---------------+---------+-----------+----------+--------------+ RIGHT    CompressibilityPhasicitySpontaneityPropertiesThrombus Aging +---------+---------------+---------+-----------+----------+--------------+ CFV      Full           Yes      Yes                                 +---------+---------------+---------+-----------+----------+--------------+ SFJ      Full                                                        +---------+---------------+---------+-----------+----------+--------------+ FV Prox  Full                                                        +---------+---------------+---------+-----------+----------+--------------+ FV Mid   Full                                                        +---------+---------------+---------+-----------+----------+--------------+ FV DistalFull                                                        +---------+---------------+---------+-----------+----------+--------------+ PFV      Full                                                        +---------+---------------+---------+-----------+----------+--------------+  POP      Full           Yes      Yes                                  +---------+---------------+---------+-----------+----------+--------------+ PTV      Full                                                        +---------+---------------+---------+-----------+----------+--------------+ PERO     Full                                                        +---------+---------------+---------+-----------+----------+--------------+   +----+---------------+---------+-----------+----------+--------------+ LEFTCompressibilityPhasicitySpontaneityPropertiesThrombus Aging +----+---------------+---------+-----------+----------+--------------+ CFV Full           Yes      Yes                                 +----+---------------+---------+-----------+----------+--------------+     Summary: RIGHT: - There is no evidence of deep vein thrombosis in the lower extremity.  - No cystic structure found in the popliteal fossa.  LEFT: - No evidence of common femoral vein obstruction.  *See table(s) above for measurements and observations.    Preliminary     Procedures Procedures    Medications Ordered in ED Medications  ketorolac (TORADOL) injection 60 mg (has no administration in time range)    ED Course/ Medical Decision Making/ A&P                           Medical Decision Making Risk Prescription drug management.  46 year old male presenting with right leg pain.  He has intact pulses and sensations, and his compartments are soft.  Low suspicion for compartment syndrome.  He denies any injury or falls, no visible deformities, and given his description of pain that this sounds more muscular in nature.  I do not think he needs an x-ray at this time.  Given he is having popliteal pain, a DVT study was ordered.  This was overall negative.  No evidence of Baker's cyst.  His Thompson test is negative and I do not see any evidence of Achilles tendon rupture.  I suspect musculoskeletal strain. Pain does not sound radicular in nature.   Will provide Toradol,  muscle relaxers, encouraged RICE protocol and follow-up with PCP.  He may benefit from physical therapy. Handout provided with exercises/ stretching.  He was understanding is agreeable.  Stable for discharge.  Final Clinical Impression(s) / ED Diagnoses Final diagnoses:  Right leg pain    Rx / DC Orders ED Discharge Orders          Ordered    methocarbamol (ROBAXIN) 500 MG tablet  2 times daily        04/01/22 1124    naproxen (NAPROSYN) 500 MG tablet  2 times daily        04/01/22 1124              Sharyn Lull  A, PA-C 04/01/22 1125    Godfrey Pick, MD 04/01/22 Pauline Aus

## 2022-04-01 NOTE — Discharge Instructions (Addendum)
Your work-up today did not show any evidence of cysts or clots in your leg.  I suspect this may be musculoskeletal strain or tear.  Please take the naproxen as needed for pain, as well as the muscle relaxer.  You may also ice and elevate your leg.  I have provided exercises to help improve your pain as well.  Please follow-up with your primary care doctor as you may benefit from physical physical therapy or evaluation.  Please return to the ER for any new or worsening symptoms.

## 2022-04-07 NOTE — Progress Notes (Deleted)
   CC: ***  HPI:   [name] is a 46 y.o. ***    LEG PAIN - description - location - duration - radiation - provoking  - palliative - medications - maneuvers (McMurray and Thessaly)   BACK PAIN - X    Past Medical History:  Diagnosis Date   Bladder mass    Chronic back pain since 2018   work injury   Migraine    Phimosis    Wears glasses      Review of Systems:    Reports *** Denies *** (subjective fever?, pain anywhere?, bowel changes?)   Physical Exam:  There were no vitals filed for this visit.  General:   awake and alert, sitting comfortably in chair, cooperative, not in acute distress Skin:   warm and dry, intact without any obvious lesions or scars, no rashes or lesions  Head:   normocephalic and atraumatic, oral mucosa moist with good dentition, no lymphadenopathy Eyes:   extraocular movements intact, conjunctivae pink, pupils round and reactive to light, no periorbital swelling or scleral icterus Ears:   pinnae normal, no discharge or external lesions  Nose:   symmetrical and without mucosal inflammation, no external lesions or discharge Lungs:   normal respiratory effort, breathing unlabored, symmetrical chest rise, no crackles or wheezing Cardiac:   regular rate and rhythm, normal S1 and S2, capillary refill 2-3 seconds, dorsalis pedis pulses intact bilaterally, no pitting edema Abdomen:   soft and non-distended, normoactive bowel sounds present in all four quadrants, no guarding or palpable masses Musculoskeletal:   full range of motion in joints, motor strength 5 /5 in all four extremities, no obvious deformities or joint tenderness Neurologic:   oriented to person-place-time, moving all extremities, sensation to light touch intact, no facial droop Psychiatric:   mood and affect normal, intelligible speech    Assessment & Plan:   No problem-specific Assessment & Plan notes found for this encounter.     See Encounters Tab  for problem based charting.  Patient {GC/GE:3044014::"discussed with","seen with"} Dr. {NAMES:[id number]::"Guilloud","Hoffman","Mullen","Narendra","Williams","Vincent"}

## 2022-04-08 ENCOUNTER — Other Ambulatory Visit: Payer: Self-pay

## 2022-04-08 ENCOUNTER — Encounter: Payer: Self-pay | Admitting: Internal Medicine

## 2022-04-08 ENCOUNTER — Ambulatory Visit (INDEPENDENT_AMBULATORY_CARE_PROVIDER_SITE_OTHER): Payer: Medicaid Other | Admitting: Internal Medicine

## 2022-04-08 ENCOUNTER — Other Ambulatory Visit: Payer: Self-pay | Admitting: Internal Medicine

## 2022-04-08 ENCOUNTER — Ambulatory Visit (HOSPITAL_COMMUNITY)
Admission: RE | Admit: 2022-04-08 | Discharge: 2022-04-08 | Disposition: A | Discharge status: Home / Self Care | Payer: Medicaid Other | Source: Ambulatory Visit | Attending: Internal Medicine | Admitting: Internal Medicine

## 2022-04-08 VITALS — BP 133/81 | HR 66 | Temp 98.1°F | Ht 72.0 in | Wt 236.5 lb

## 2022-04-08 DIAGNOSIS — G43909 Migraine, unspecified, not intractable, without status migrainosus: Secondary | ICD-10-CM

## 2022-04-08 DIAGNOSIS — M25561 Pain in right knee: Secondary | ICD-10-CM

## 2022-04-08 DIAGNOSIS — N179 Acute kidney failure, unspecified: Secondary | ICD-10-CM

## 2022-04-08 DIAGNOSIS — Z0000 Encounter for general adult medical examination without abnormal findings: Secondary | ICD-10-CM

## 2022-04-08 MED ORDER — PROPRANOLOL HCL ER 60 MG PO CP24: 60.0000 mg | ORAL_CAPSULE | Freq: Every day | ORAL | 3 refills | Status: AC

## 2022-04-08 NOTE — Assessment & Plan Note (Signed)
-   Colonoscopy done in 2021

## 2022-04-08 NOTE — Patient Instructions (Signed)
Thank you, [place] for allowing [place] to provide your care today. Today we discussed:  Knee pain: We are getting xrays of your right knee to evaluate what could be causing this pain. I have also placed a referral to physical therapy. You can also use a heating pad and take ibuprofen/aleve/advil as needed for the pain, with tylenol Migraines: Start taking propranolol 80 mg once a day to hopefully prevent your migraines  I have ordered the following labs for you:   Lab Orders         BMP8+Anion Gap      Tests ordered today:  Right knee xrays  Referrals ordered today:    Referral Orders         Ambulatory referral to Physical Therapy      I have ordered the following medication/changed the following medications:   Stop the following medications: Medications Discontinued During This Encounter  Medication Reason   gemcitabine (GEMZAR) chemo syringe for bladder instillation 2,000 mg    oxyCODONE-acetaminophen (PERCOCET) 5-325 MG tablet      Start the following medications: Meds ordered this encounter  Medications   propranolol ER (INDERAL LA) 60 MG 24 hr capsule    Sig: Take 1 capsule (60 mg total) by mouth daily.    Dispense:  30 capsule    Refill:  3     Follow up: 2-3 months for migraine follow up   Should you have any questions or concerns please call the internal medicine clinic at [phone].     Buddy Duty, D.O. [place]

## 2022-04-08 NOTE — Assessment & Plan Note (Signed)
The patient states that he has had ongoing right knee pain for the past 2 months. 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 He states that he just woke up with this pain one day and is unsure what brought this on.  He describes not being able to fully flex or extend his knee and has pain with movement and ambulation.  Patient has taken Advil/NSAIDs for this pain with no relief.   He was seen in the emergency department earlier this month for this right knee pain.  An ultrasound was done to rule out DVT in his right lower extremity and the study was negative.  No other imaging or work-up was done at this time.  Plan: - Referral to physical therapy - Continue RICE therapy - NSAIDs PRN (BMP today) - R knee xrays

## 2022-04-08 NOTE — Assessment & Plan Note (Addendum)
The patient states that he has had a history of chronic migraines, however, he does not take any prescription medications for this.  The patient will take as needed NSAIDs during an acute migraine and will also lay down in a dark room to alleviate his pain.  He gets about 20 migrainous headaches per month on average.  The patient can often tell when the migraine is coming on, as he will have floaters in his eyes.  He does have some photophobia during these episodes but denies any phonophobia. He does have a history of trauma to his left orbit and head in 2012 states that his migraines have been ongoing since this time.  Given the of migraines this patient is suffering from month, we discussed starting a medication for migraine prevention.  Patient is in agreement with this plan, however, he states that he does not want to take any "tablet" occasions, as a taste to chalky, and he will only take a capsule.  Plan: - Start propranolol 60 mg daily, capsule (amitriptyline and nurtec only available as tablets) - Follow up in ~2-3 months to assess response

## 2022-04-08 NOTE — Progress Notes (Signed)
CC: new patient  HPI:  Mr.Toa Chauncey Cruel Scow is a 46 y.o. male living with a history stated below and presents today to establish care with the [facility]. Please see problem based assessment and plan for additional details.  Past Medical Hx: none  Meds: none  Surg hx: bunion removal bilaterally; patellar fracture surgery 21-Nov-1987, L5 fusion 11-20-2017, colonoscopy 11/21/19; circumcision 2020/11/20   Social Hx: smokes ~4 cigarettes/day with vaping -has been smoking since age 65, smoked up to 2.5 packs per day (quit in November 20, 2009, restarted in 2016-11-20); no alcohol; no other illicit substance use; works as a Arboriculturist x 11 years   Family Hx: father died of liver cancer; uncle died of lung cancer x2; mother died of brain aneurysm in 11/20/2017; another uncle died of heart failure  Negative family history for hypertension, diabetes     Past Medical History:  Diagnosis Date   Bladder mass    Chronic back pain since Nov 20, 2016   work injury   Migraine    Phimosis    Wears glasses     Current Outpatient Medications on File Prior to Visit  Medication Sig Dispense Refill   gabapentin (NEURONTIN) 100 MG capsule Take 1 capsule (100 mg total) by mouth 3 (three) times daily. (Patient taking differently: Take 100 mg by mouth as needed.) 90 capsule 3   ibuprofen (ADVIL) 600 MG tablet Take 600 mg by mouth every 6 (six) hours as needed.     No current facility-administered medications on file prior to visit.    Family History  Problem Relation Age of Onset   Hyperlipidemia Mother    Hypertension Mother    Aneurysm Mother        brain   Cancer Father        lung primary he thinks and spread to heart and liver   Asthma Sister    Alcohol abuse Brother    Drug abuse Brother    Colon polyps Neg Hx    Colon cancer Neg Hx    Esophageal cancer Neg Hx    Stomach cancer Neg Hx    Rectal cancer Neg Hx     Social History   Socioeconomic History   Marital status: Married    Spouse name: Not on file   Number  of children: Not on file   Years of education: Not on file   Highest education level: Not on file  Occupational History   Not on file  Tobacco Use   Smoking status: Every Day    Packs/day: 0.50    Years: 20.00    Total pack years: 10.00    Types: Cigarettes   Smokeless tobacco: Never  Vaping Use   Vaping Use: Never used  Substance and Sexual Activity   Alcohol use: No   Drug use: No   Sexual activity: Not on file  Other Topics Concern   Not on file  Social History Narrative   Not on file   Social Determinants of Health   Financial Resource Strain: Not on file  Food Insecurity: Not on file  Transportation Needs: Not on file  Physical Activity: Not on file  Stress: Not on file  Social Connections: Not on file  Intimate Partner Violence: Not on file    Review of Systems: ROS negative except for what is noted on the assessment and plan.  Vitals:   04/08/22 1515  BP: 133/81  Pulse: 66  Temp: 98.1 F (36.7 C)  TempSrc: Oral  SpO2:  97%  Weight: 236 lb 8 oz (107.3 kg)  Height: 6' (1.829 m)    Physical Exam: Constitutional: well-appearing male sitting in chair, in no acute distress Cardiovascular: regular rate and rhythm, no m/r/g Pulmonary/Chest: normal work of breathing on room air, lungs clear to auscultation bilaterally Abdominal: soft, non-tender, non-distended MSK: normal bulk and tone; limited flexion/extension of R knee. R knee with no tenderness to palpation, no effusions palpated. Negative McMurrary test bilaterally. Unable to complete [facility] test, as patient unable to bear weight on right leg. Negative Lachmans test bilaterally Neurological: alert & oriented x 3, no focal deficit Skin: warm and dry Psych: normal mood and behavior  Assessment & Plan:     Patient discussed with Dr. Cain Sieve  Migraines The patient states that he has had a history of chronic migraines, however, he does not take any prescription medications for this.  The patient will  take as needed NSAIDs during an acute migraine and will also lay down in a dark room to alleviate his pain.  He gets about 20 migrainous headaches per month on average.  The patient can often tell when the migraine is coming on, as he will have floaters in his eyes.  He does have some photophobia during these episodes but denies any phonophobia. He does have a history of trauma to his left orbit and head in 2012 states that his migraines have been ongoing since this time.  Given the of migraines this patient is suffering from month, we discussed starting a medication for migraine prevention.  Patient is in agreement with this plan, however, he states that he does not want to take any "tablet" occasions, as a taste to chalky, and he will only take a capsule.  Plan: - Start propranolol 60 mg daily, capsule (amitriptyline and nurtec only available as tablets) - Follow up in ~2-3 months to assess response  Right knee pain The patient states that he has had ongoing right knee pain for the past 2 months. 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 He states that he just woke up with this pain one day and is unsure what brought this on.  He describes not being able to fully flex or extend his knee and has pain with movement and ambulation.  Patient has taken Advil/NSAIDs for this pain with no relief.   He was seen in the emergency department earlier this month for this right knee pain.  An ultrasound was done to rule out DVT in his right lower extremity and the study was negative.  No other imaging or work-up was done at this time.  Plan: - Referral to physical therapy - Continue RICE therapy - NSAIDs PRN (BMP today) - R knee xrays  Healthcare maintenance - Colonoscopy done in 2021   Julian, D.O. [facility], PGY-2 Phone: [phone] Date 04/08/2022 Time 4:31 PM

## 2022-04-08 NOTE — Progress Notes (Deleted)
CC: new patient  HPI:  Adam Snow is a 46 y.o. male living with a history stated below and presents today to establish care with the [facility]. Please see problem based assessment and plan for additional details.  Past Medical History:  Diagnosis Date   Bladder mass    Chronic back pain since 2018   work injury   Migraine    Phimosis    Wears glasses     Current Outpatient Medications on File Prior to Visit  Medication Sig Dispense Refill   gabapentin (NEURONTIN) 100 MG capsule Take 1 capsule (100 mg total) by mouth 3 (three) times daily. (Patient taking differently: Take 100 mg by mouth as needed.) 90 capsule 3   ibuprofen (ADVIL) 600 MG tablet Take 600 mg by mouth every 6 (six) hours as needed.     methocarbamol (ROBAXIN) 500 MG tablet Take 1 tablet (500 mg total) by mouth 2 (two) times daily. 20 tablet 0   naproxen (NAPROSYN) 500 MG tablet Take 1 tablet (500 mg total) by mouth 2 (two) times daily for 7 days. 14 tablet 0   naproxen sodium (ALEVE) 220 MG tablet Take by mouth.     Olopatadine HCl 0.2 % SOLN as needed.      oxyCODONE-acetaminophen (PERCOCET) 5-325 MG tablet Take 1 tablet by mouth every 4 (four) hours as needed for up to 12 doses for severe pain. 12 tablet 0   Current Facility-Administered Medications on File Prior to Visit  Medication Dose Route Frequency Provider Last Rate Last Admin   gemcitabine (GEMZAR) chemo syringe for bladder instillation 2,000 mg  2,000 mg Bladder Instillation Once Janith Lima, MD        Family History  Problem Relation Age of Onset   Hyperlipidemia Mother    Hypertension Mother    Aneurysm Mother        brain   Cancer Father        lung primary he thinks and spread to heart and liver   Asthma Sister    Alcohol abuse Brother    Drug abuse Brother    Colon polyps Neg Hx    Colon cancer Neg Hx    Esophageal cancer Neg Hx    Stomach cancer Neg Hx    Rectal cancer Neg Hx     Social History   Socioeconomic History    Marital status: Married    Spouse name: Not on file   Number of children: Not on file   Years of education: Not on file   Highest education level: Not on file  Occupational History   Not on file  Tobacco Use   Smoking status: Every Day    Packs/day: 0.50    Years: 20.00    Total pack years: 10.00    Types: Cigarettes   Smokeless tobacco: Never  Vaping Use   Vaping Use: Never used  Substance and Sexual Activity   Alcohol use: No   Drug use: No   Sexual activity: Not on file  Other Topics Concern   Not on file  Social History Narrative   Not on file   Social Determinants of Health   Financial Resource Strain: Not on file  Food Insecurity: Not on file  Transportation Needs: Not on file  Physical Activity: Not on file  Stress: Not on file  Social Connections: Not on file  Intimate Partner Violence: Not on file    Review of Systems: ROS negative except for what is noted on  the assessment and plan.  There were no vitals filed for this visit.  Physical Exam: Constitutional: well-appearing *** sitting in ***, in no acute distress HENT: normocephalic atraumatic, mucous membranes moist Eyes: conjunctiva non-erythematous Cardiovascular: regular rate and rhythm, no m/r/g Pulmonary/Chest: normal work of breathing on room air, lungs clear to auscultation bilaterally Abdominal: soft, non-tender, non-distended MSK: normal bulk and tone Neurological: alert & oriented x 3, no focal deficit Skin: warm and dry Psych: normal mood and behavior  Assessment & Plan:     Patient {GC/GE:3044014::"discussed with","seen with"} Dr. {YTKZS:[id number]::"[id number]","R. Hoffman","Mullen","Narendra","Vincent","Guilloud","Lau","Machen"}  No problem-specific Assessment & Plan notes found for this encounter.   Buddy Duty, D.O. [facility], PGY-2 Phone: [phone] Date 04/08/2022 Time 3:08 PM

## 2022-04-09 LAB — BMP8+ANION GAP
Anion Gap: 15 mmol/L (ref 10.0–18.0)
BUN/Creatinine Ratio: 12 (ref 9–20)
BUN: 15 mg/dL (ref 6–24)
CO2: 23 mmol/L (ref 20–29)
Calcium: 10.2 mg/dL (ref 8.7–10.2)
Chloride: 102 mmol/L (ref 96–106)
Creatinine, Ser: 1.25 mg/dL (ref 0.76–1.27)
Glucose: 99 mg/dL (ref 70–99)
Potassium: 4.6 mmol/L (ref 3.5–5.2)
Sodium: 140 mmol/L (ref 134–144)
eGFR: 71 mL/min/{1.73_m2} (ref >59)

## 2022-04-09 NOTE — Progress Notes (Signed)
Internal Medicine Clinic Attending ° °Case discussed with Dr. Atway  At the time of the visit.  We reviewed the resident’s history and exam and pertinent patient test results.  I agree with the assessment, diagnosis, and plan of care documented in the resident’s note.  °

## 2022-04-10 NOTE — Progress Notes (Signed)
Xray of right knee showed bipartite patella with mild degenerative changes- unclear if this is the etiology of the patient's leg pain.  Cr is within normal limits on BMP. Attempted to call patient regarding results.

## 2022-04-30 NOTE — Therapy (Signed)
OUTPATIENT PHYSICAL THERAPY LOWER EXTREMITY EVALUATION   Patient Name: Adam Snow MRN: [id number] DOB:08-15-1976, 46 y.o., male Today's Date: 04/30/2022    Past Medical History:  Diagnosis Date   Bladder mass    Chronic back pain since 2018   work injury   Migraine    Phimosis    Wears glasses    Past Surgical History:  Procedure Laterality Date   CIRCUMCISION N/A 10/30/2020   Procedure: CIRCUMCISION ADULT;  Surgeon: Janith Lima, MD;  Location: [facility];  Service: Urology;  Laterality: N/A;   FOOT SURGERY Bilateral 1998/2008   LUMBAR FUSION  2019   L5   PATELLA FRACTURE SURGERY Right 1989   TRANSURETHRAL RESECTION OF BLADDER TUMOR N/A 06/09/2020   Procedure: CYSTOSCOPY WITH BLADDER BIOPSIES/BILATERAL RETROGRADE PYELOGRAM/ dilation of urethral stricture;  Surgeon: Janith Lima, MD;  Location: [facility];  Service: Urology;  Laterality: N/A;    [place] EXTRACTION  1995   Patient Active Problem List   Diagnosis Date Noted   Migraines 04/08/2022   Right knee pain 04/08/2022   Healthcare maintenance 04/08/2022   Status post lumbar surgery 10/09/2017    PCP: Lottie Mussel, MD  REFERRING PROVIDER: Lottie Mussel, MD  REFERRING DIAG: Right knee pain  THERAPY DIAG:  No diagnosis found.  Rationale for Evaluation and Treatment Rehabilitation  ONSET DATE: ***   SUBJECTIVE:  SUBJECTIVE STATEMENT: ***  PERTINENT HISTORY: ***  PAIN:  Are you having pain? Yes:  NPRS scale: ***/10 Pain location: *** Pain description: *** Aggravating factors: *** Relieving factors: ***  PRECAUTIONS: None  WEIGHT BEARING RESTRICTIONS No  FALLS:  Has patient fallen in last 6 months? {fallsyesno:27318}  LIVING ENVIRONMENT: Lives with: {[facility] lives with:25569::"lives with their family"} Lives in: {Lives in:25570} Stairs: {opstairs:27293} Has following equipment at home: {Assistive devices:23999}  OCCUPATION: ***  PLOF:  Independent  PATIENT GOALS: ***   OBJECTIVE:  DIAGNOSTIC FINDINGS:  X-ray Right knee 04/08/2022: Bipartite patella is noted. Mild medial joint space narrowing is noted. No joint effusion is seen. No fracture is noted.  PATIENT SURVEYS:  LEFS ***  COGNITION:  Overall cognitive status: Within functional limits for tasks assessed     SENSATION: WFL  EDEMA:  {edema:24020}  MUSCLE LENGTH: ***  POSTURE:  ***  PALPATION: ***  LOWER EXTREMITY ROM:  Active ROM Right eval Left eval  Knee flexion    Knee extension     LOWER EXTREMITY MMT:  MMT Right eval Left eval  Hip flexion    Hip extension    Hip abduction    Hip internal rotation    Hip external rotation    Knee flexion    Knee extension     LOWER EXTREMITY SPECIAL TESTS:  {LEspecialtests:26242}  FUNCTIONAL TESTS:  {Functional tests:24029}  GAIT: Distance walked: *** Assistive device utilized: {Assistive devices:23999} Level of assistance: {Levels of assistance:24026} Comments: ***   TODAY'S TREATMENT: ***   PATIENT EDUCATION:  Education details: Exam findings, POC, HEP Person educated: Patient Education method: Explanation, Demonstration, Tactile cues, Verbal cues, and Handouts Education comprehension: verbalized understanding, returned demonstration, verbal cues required, tactile cues required, and needs further education  HOME EXERCISE PROGRAM: ***   ASSESSMENT: CLINICAL IMPRESSION: Patient is a 46 y.o. male who was seen today for physical therapy evaluation and treatment for ***.    OBJECTIVE IMPAIRMENTS {opptimpairments:25111}.   ACTIVITY LIMITATIONS {activitylimitations:27494}  PARTICIPATION LIMITATIONS: {participationrestrictions:25113}  PERSONAL FACTORS {Personal factors:25162} are also affecting patient's functional outcome.   REHAB POTENTIAL: {rehabpotential:25112}  CLINICAL DECISION MAKING: {clinical decision making:25114}  EVALUATION COMPLEXITY: {Evaluation  complexity:25115}   GOALS: Goals reviewed with patient? {yes/no:20286}  SHORT TERM GOALS: Target date: {follow up:25551}   Patient will be I with initial HEP in order to progress with therapy. Baseline: Goal status: INITIAL  2.  *** Baseline:  Goal status: INITIAL  3.  *** Baseline:  Goal status: INITIAL  LONG TERM GOALS: Target date: {follow up:25551}   Patient will be I with final HEP to maintain progress from PT. Baseline:  Goal status: INITIAL  2.  *** Baseline:  Goal status: INITIAL  3.  *** Baseline:  Goal status: INITIAL  4.  *** Baseline:  Goal status: INITIAL   PLAN: PT FREQUENCY: {rehab frequency:25116}  PT DURATION: {rehab duration:25117}  PLANNED INTERVENTIONS: {rehab planned interventions:25118::"Therapeutic exercises","Therapeutic activity","Neuromuscular re-education","Balance training","Gait training","Patient/Family education","Self Care","Joint mobilization"}  PLAN FOR NEXT SESSION: Review HEP and progress PRN, ***   Hilda Blades, PT, DPT, LAT, ATC 04/30/22  3:57 PM Phone: [phone] Fax: [phone]   Check all possible CPT codes: {cptcodes:24818}     If treatment provided at initial evaluation, no treatment charged due to lack of authorization.

## 2022-05-01 ENCOUNTER — Other Ambulatory Visit: Payer: Self-pay

## 2022-05-01 ENCOUNTER — Ambulatory Visit: Payer: Medicaid Other | Attending: Internal Medicine | Admitting: Physical Therapy

## 2022-05-01 ENCOUNTER — Encounter: Payer: Self-pay | Admitting: Physical Therapy

## 2022-05-01 DIAGNOSIS — G8929 Other chronic pain: Secondary | ICD-10-CM | POA: Insufficient documentation

## 2022-05-01 DIAGNOSIS — M25561 Pain in right knee: Secondary | ICD-10-CM | POA: Insufficient documentation

## 2022-05-01 DIAGNOSIS — R2689 Other abnormalities of gait and mobility: Secondary | ICD-10-CM | POA: Insufficient documentation

## 2022-05-01 DIAGNOSIS — M6281 Muscle weakness (generalized): Secondary | ICD-10-CM | POA: Insufficient documentation

## 2022-05-01 NOTE — Patient Instructions (Signed)
Access Code: [email or web address] URL: [email or web address] Date: 05/01/2022 Prepared by: Hilda Blades  Exercises - Long Sitting Ankle Plantar Flexion with Resistance  - 1-2 x daily - 2 sets - 20-30 reps - Long Sitting Ankle Eversion with Resistance  - 1-2 x daily - 2 sets - 20-30 reps - Supine Quad Set  - 1-2 x daily - 3 sets - 10 reps - 5 seconds hold - Supine Heel Slide  - 1-2 x daily - 2 sets - 10 reps - Long Sitting Hamstring Set  - 1-2 x daily - 2 sets - 10 reps - 5 seconds hold

## 2022-05-03 NOTE — Therapy (Signed)
OUTPATIENT PHYSICAL THERAPY TREATMENT NOTE   Patient Name: Adam Snow MRN: [id number] DOB:Mar 10, 1976, 46 y.o., male Today's Date: 05/03/2022  PCP: Lottie Mussel, MD   REFERRING PROVIDER: Lottie Mussel, MD   END OF SESSION:    Past Medical History:  Diagnosis Date   Bladder mass    Chronic back pain since 2018   work injury   Migraine    Phimosis    Wears glasses    Past Surgical History:  Procedure Laterality Date   CIRCUMCISION N/A 10/30/2020   Procedure: CIRCUMCISION ADULT;  Surgeon: Janith Lima, MD;  Location: [facility];  Service: Urology;  Laterality: N/A;   FOOT SURGERY Bilateral 1998/2008   LUMBAR FUSION  2019   L5   PATELLA FRACTURE SURGERY Right 1989   TRANSURETHRAL RESECTION OF BLADDER TUMOR N/A 06/09/2020   Procedure: CYSTOSCOPY WITH BLADDER BIOPSIES/BILATERAL RETROGRADE PYELOGRAM/ dilation of urethral stricture;  Surgeon: Janith Lima, MD;  Location: [facility];  Service: Urology;  Laterality: N/A;   [place] EXTRACTION  1995   Patient Active Problem List   Diagnosis Date Noted   Migraines 04/08/2022   Right knee pain 04/08/2022   Healthcare maintenance 04/08/2022   Status post lumbar surgery 10/09/2017    REFERRING DIAG: Right knee pain  THERAPY DIAG:  No diagnosis found.  Rationale for Evaluation and Treatment Rehabilitation  PERTINENT HISTORY: Right patellar fracture surgery 1989, L5 fusion 2019  PRECAUTIONS: None  SUBJECTIVE: ***  PAIN:  Are you having pain? Yes:  NPRS scale: 0/10 at rest (patient reports >10/10 when pain occurs) Pain location: Right knee, posterior with pain shooting to back of ankle Pain description: "forcing a stretch of rubber band" Aggravating factors: Standing, stairs, getting out of car after sitting long periods, bending or straightening  Relieving factors: Sitting, rubbing the back of knee  PATIENT GOALS: Right knee pain relief and full range of  motion   OBJECTIVE: (objective measures completed at initial evaluation unless otherwise dated) PATIENT SURVEYS:  LEFS 13/80   MUSCLE LENGTH: Patient reports pain with hamstring and calf muscle length testing   PALPATION: Tender to hamstring and gastroc muscle tendons posterior to knee, gastroc muscle belly   LOWER EXTREMITY ROM:   Active ROM Right eval Left eval  Knee flexion 105 -  Knee extension 5 lacking 0             Right knee PROM grossly WFL but with increased pain at posterior knee   LOWER EXTREMITY MMT:   MMT Right eval Left eval  Hip flexion 4 4  Hip extension - -  Hip abduction - -  Knee flexion 4-* 5  Knee extension 4-* 5  Ankle dorsiflexion 5 5  Ankle plantarflexion 3* 5  Ankle eversion 4* 5            Right knee and ankle strength limited secondary to pain posterior knee   LOWER EXTREMITY SPECIAL TESTS:  Meniscus and ligamentous testing negative   FUNCTIONAL TESTS:  Not assessed   GAIT: Assistive device utilized: None Level of assistance: Complete Independence Comments: Antalgic on right with knee decreased knee extension/flexion with gait     TODAY'S TREATMENT: [facility] Adult PT Treatment:                                                DATE:  05/06/2022 Therapeutic Exercise: Longsitting ankle PF and eversion with red x 20 each Quad set x 10 with 5 sec hold Heel slide x 10 Hamstring isometric 10 x 5 sec   [facility] Adult PT Treatment:                                                DATE: 05/01/2022 Therapeutic Exercise: Longsitting ankle PF and eversion with red x 20 each Quad set x 10 with 5 sec hold Heel slide x 10 Hamstring isometric 10 x 5 sec     PATIENT EDUCATION:  Education details: HEP Person educated: Patient Education method: Consulting civil engineer, Media planner, Corporate treasurer cues, Verbal cues, and Handouts Education comprehension: verbalized understanding, returned demonstration, verbal cues required, tactile cues required, and needs further  education   HOME EXERCISE PROGRAM: Access Code: [email or web address]     ASSESSMENT: CLINICAL IMPRESSION: Patient tolerated therapy well with no adverse effects. ***  Patient is a 46 y.o. male who was seen today for physical therapy evaluation and treatment for chronic right posterior knee pain. Currently his symptoms seem most consistent with muscular/tendinous injury of the calf and hamstring that are limiting his knee motion and ability to perform household or work related tasks. He does not have any intraarticular symptoms and all meniscal testing negative, and with full knee PROM and no specific mechanism it seems unlikely to be meniscus injury. All ligamentous testing negative and lumbar screen negative for reproduction of symptoms.      OBJECTIVE IMPAIRMENTS Abnormal gait, decreased activity tolerance, decreased ROM, decreased strength, and pain.    ACTIVITY LIMITATIONS lifting, bending, sitting, standing, squatting, sleeping, stairs, dressing, and locomotion level   PARTICIPATION LIMITATIONS: meal prep, cleaning, driving, community activity, occupation, and yard work   PERSONAL FACTORS Past/current experiences and Time since onset of injury/illness/exacerbation are also affecting patient's functional outcome.      GOALS: Goals reviewed with patient? Yes   SHORT TERM GOALS: Target date: 05/29/2022    Patient will be I with initial HEP in order to progress with therapy. Baseline: HEP provided at eval Goal status: INITIAL   2.  Patient will demonstrate right knee AROM 0-120 deg in order to improve gait and ability to put on socks without limitation. Baseline: right knee AROM 5-105 deg Goal status: INITIAL   LONG TERM GOALS: Target date: 06/26/2022    Patient will be I with final HEP to maintain progress from PT. Baseline: HEP provided at eval Goal status: INITIAL   2.  Patient will report LEFS >/= 57/80 in order to indicate improved ability to perform household and work related tasks  that require walking, squatting, sitting, standing Baseline: LEFS 13/80 Goal status: INITIAL   3.  Patient will demonstrate right knee strength 5/5 MMT in order to improve ability to go up/down stairs without limitation. Baseline: right knee strength grossly 4-/5 MMT with pain Goal status: INITIAL   4.  Patient will report right knee pain level with all standing, walking, and squatting activities </=  4/10 pain in order to allow him to return to work without limitation. Baseline: patient reports pain >10/10 Goal status: INITIAL     PLAN: PT FREQUENCY: 1x/week   PT DURATION: 8 weeks   PLANNED INTERVENTIONS: Therapeutic exercises, Therapeutic activity, Neuromuscular re-education, Balance training, Gait training, Patient/Family education, Self Care, Joint mobilization, Joint manipulation, Aquatic Therapy, Dry Needling, Cryotherapy, Moist  heat, Taping, Manual therapy, and Re-evaluation   PLAN FOR NEXT SESSION: Review HEP and progress PRN, manual for hamstring/calf region and gentle stretching, gradual strength progression for hamstring and calf, progress to close chain exercises as able    Hilda Blades, PT, DPT, LAT, ATC 05/03/22  1:29 PM Phone: [phone] Fax: [phone]

## 2022-05-06 ENCOUNTER — Other Ambulatory Visit: Payer: Self-pay

## 2022-05-06 ENCOUNTER — Encounter: Payer: Self-pay | Admitting: Physical Therapy

## 2022-05-06 ENCOUNTER — Ambulatory Visit: Payer: Medicaid Other | Admitting: Physical Therapy

## 2022-05-06 DIAGNOSIS — M25561 Pain in right knee: Secondary | ICD-10-CM | POA: Diagnosis not present

## 2022-05-06 DIAGNOSIS — M6281 Muscle weakness (generalized): Secondary | ICD-10-CM

## 2022-05-06 DIAGNOSIS — R2689 Other abnormalities of gait and mobility: Secondary | ICD-10-CM | POA: Diagnosis not present

## 2022-05-06 DIAGNOSIS — G8929 Other chronic pain: Secondary | ICD-10-CM | POA: Diagnosis not present

## 2022-05-13 ENCOUNTER — Ambulatory Visit: Payer: Medicaid Other

## 2022-05-13 DIAGNOSIS — M6281 Muscle weakness (generalized): Secondary | ICD-10-CM | POA: Diagnosis not present

## 2022-05-13 DIAGNOSIS — R2689 Other abnormalities of gait and mobility: Secondary | ICD-10-CM | POA: Diagnosis not present

## 2022-05-13 DIAGNOSIS — G8929 Other chronic pain: Secondary | ICD-10-CM | POA: Diagnosis not present

## 2022-05-13 DIAGNOSIS — M25561 Pain in right knee: Secondary | ICD-10-CM | POA: Diagnosis not present

## 2022-05-13 NOTE — Therapy (Addendum)
OUTPATIENT PHYSICAL THERAPY TREATMENT NOTE PHYSICAL THERAPY DISCHARGE SUMMARY  Visits from Start of Care: 3  Current functional level related to goals / functional outcomes: No re-assessment of goals.    Remaining deficits: Status unknown   Education / Equipment: N/a   Patient agrees to discharge. Patient goals were not met. Patient is being discharged due to not returning since the last visit.   Patient Name: Adam Snow MRN: [id number] DOB:[birth year], 46 y.o., male Today's Date: 05/13/2022  PCP: Lottie Mussel, MD   REFERRING PROVIDER: Lottie Mussel, MD   END OF SESSION:   PT End of Session - 05/13/22 0828     Visit Number 3    Number of Visits 9    Date for PT Re-Evaluation 06/26/22    Authorization Type [employer]    Authorization Time Period 05/06/2022 - 07/04/2022    Authorization - Visit Number 2    Authorization - Number of Visits 7    PT Start Time 0839    PT Stop Time 0922    PT Time Calculation (min) 43 min    Activity Tolerance Patient tolerated treatment well    Behavior During Therapy [facility] for tasks assessed/performed             Past Medical History:  Diagnosis Date   Bladder mass    Chronic back pain since 2018   work injury   Migraine    Phimosis    Wears glasses    Past Surgical History:  Procedure Laterality Date   CIRCUMCISION N/A 10/30/2020   Procedure: CIRCUMCISION ADULT;  Surgeon: Janith Lima, MD;  Location: [facility];  Service: Urology;  Laterality: N/A;   FOOT SURGERY Bilateral 1998/2008   LUMBAR FUSION  2019   L5   PATELLA FRACTURE SURGERY Right 1989   TRANSURETHRAL RESECTION OF BLADDER TUMOR N/A 06/09/2020   Procedure: CYSTOSCOPY WITH BLADDER BIOPSIES/BILATERAL RETROGRADE PYELOGRAM/ dilation of urethral stricture;  Surgeon: Janith Lima, MD;  Location: [facility];  Service: Urology;  Laterality: N/A;   [place] EXTRACTION  1995   Patient Active Problem List   Diagnosis  Date Noted   Migraines 04/08/2022   Right knee pain 04/08/2022   Healthcare maintenance 04/08/2022   Status post lumbar surgery 10/09/2017    REFERRING DIAG: Right knee pain  THERAPY DIAG:  Chronic pain of right knee  Muscle weakness (generalized)  Other abnormalities of gait and mobility  Rationale for Evaluation and Treatment Rehabilitation  PERTINENT HISTORY: Right patellar fracture surgery 1989, L5 fusion 2019  PRECAUTIONS: None  SUBJECTIVE: Patient reports he is feeling the same. The pain can be so intense that he can't go up and down the stairs or walk sometimes.   PAIN:  Are you having pain? Yes:  NPRS scale: 6/10 (patient reports >10/10 at worst) Pain location: posterior Rt knee; lateral lower leg  Pain description: "bruised completely mixed with a cramp"  Aggravating factors: Standing, stairs, getting out of car after sitting long periods, bending or straightening  Relieving factors: Sitting, rubbing the back of knee  PATIENT GOALS: Right knee pain relief and full range of motion   OBJECTIVE: (objective measures completed at initial evaluation unless otherwise dated) PATIENT SURVEYS:  LEFS 13/80   MUSCLE LENGTH: Patient reports pain with hamstring and calf muscle length testing   PALPATION: Tender to hamstring and gastroc muscle tendons posterior to knee, gastroc muscle belly  05/13/22: Concordant pain with PAIVM L4-L5   LOWER EXTREMITY ROM:  Active ROM Right eval Left eval Right 05/06/2022 05/13/22  Knee flexion 105 - 90 90; post lumbar extension 130   Knee extension 5 lacking 0               Right knee PROM grossly WFL but with increased pain at posterior knee   05/13/22: Trunk flexion AROM WNL concordant pain.   LOWER EXTREMITY MMT:   MMT Right eval Left eval  Hip flexion 4 4  Hip extension - -  Hip abduction - -  Knee flexion 4-* 5  Knee extension 4-* 5  Ankle dorsiflexion 5 5  Ankle plantarflexion 3* 5  Ankle eversion 4* 5             Right knee and ankle strength limited secondary to pain posterior knee   LOWER EXTREMITY SPECIAL TESTS:  Meniscus and ligamentous testing negative   FUNCTIONAL TESTS:  Not assessed   GAIT: Assistive device utilized: None Level of assistance: Complete Independence Comments: Antalgic on right with knee decreased knee extension/flexion with gait     TODAY'S TREATMENT: [facility] Adult PT Treatment:                                                DATE: 05/13/22 Therapeutic Exercise: Standing lumbar extension 2 x 10  Prone pressup 1 x 10  Sciatic nerve glides 1 x 10  Standing calf raise 2 x 10  Prone on elbows x 2 minutes  Heel/toe raises in sitting 2 x 10  Updated HEP  Manual Therapy: Rt Fibular head mobilization A/P grade II-III STM Rt peroneals and gastroc/soleus  LAD RLE x 1 minute  [facility] Adult PT Treatment:                                                DATE: 05/06/2022 Modalities: Electrical Stimulation with MHP (not billed due to insurance) Location: Posterior right knee Action: IFC Parameters: 10 minutes, 80-150 bps, intensity to patient tolerance Goals: Pain relief Therapeutic Exercise: Supine heel slide x 10 Seated gastroc stretch with strap 3 x 30 sec Seated soleus stretch with strap 3 x 30 sec Seated heel slide x 10 Longsitting ankle PF and eversion with red 2 x 20 each Manual: STM and TPR for right calf and hamstring with focus on right lateral gastrco and soleus Skilled palpation and monitoring of muscle tension while performing TPDN treatment Trigger Point Dry Needling Treatment: Pre-treatment instruction: Patient instructed on dry needling rationale, procedures, and possible side effects including pain during treatment (achy,cramping feeling), bruising, drop of blood, lightheadedness, nausea, sweating. Patient Consent Given: Yes Education handout provided: No Muscles treated: right lateral gastroc/soleus  Needle size and number: .30x21m x 1 Electrical stimulation  performed: No Parameters: N/A Treatment response/outcome: Twitch response elicited and Palpable decrease in muscle tension Post-treatment instructions: Patient instructed to expect possible mild to moderate muscle soreness later today and/or tomorrow. Patient instructed in methods to reduce muscle soreness and to continue prescribed HEP. If patient was dry needled over the lung field, patient was instructed on signs and symptoms of pneumothorax and, however unlikely, to see immediate medical attention should they occur. Patient was also educated on signs and symptoms of infection and to seek medical attention should  they occur. Patient verbalized understanding of these instructions and education.   [facility] Adult PT Treatment:                                                DATE: 05/01/2022 Therapeutic Exercise: Longsitting ankle PF and eversion with red x 20 each Quad set x 10 with 5 sec hold Heel slide x 10 Hamstring isometric 10 x 5 sec   PATIENT EDUCATION:  Education details: HEP update  Person educated: Patient Education method: Explanation, Demonstration, Tactile cues, Verbal cues, and Handouts Education comprehension: verbalized understanding, returned demonstration, verbal cues required, tactile cues required, and needs further education   HOME EXERCISE PROGRAM: Access Code: [email or web address]     ASSESSMENT: CLINICAL IMPRESSION: Patient noted to have concordant pain with PAIVM of L4-L5 and lumbar flexion AROM. Trialed repeated extension with patient noted to have abolishment of knee and lower leg pain following prone pressup as well as full and pain free knee flexion AROM. Able to complete targeted gastroc strengthening/ROM with patient reporting occasional tingling about the posterior knee. HEP was updated to include extension biased movement.      OBJECTIVE IMPAIRMENTS Abnormal gait, decreased activity tolerance, decreased ROM, decreased strength, and pain.    ACTIVITY LIMITATIONS lifting,  bending, sitting, standing, squatting, sleeping, stairs, dressing, and locomotion level   PARTICIPATION LIMITATIONS: meal prep, cleaning, driving, community activity, occupation, and yard work   PERSONAL FACTORS Past/current experiences and Time since onset of injury/illness/exacerbation are also affecting patient's functional outcome.      GOALS: Goals reviewed with patient? Yes   SHORT TERM GOALS: Target date: 05/29/2022    Patient will be I with initial HEP in order to progress with therapy. Baseline: HEP provided at eval Goal status: INITIAL   2.  Patient will demonstrate right knee AROM 0-120 deg in order to improve gait and ability to put on socks without limitation. Baseline: right knee AROM 5-105 deg Goal status: INITIAL   LONG TERM GOALS: Target date: 06/26/2022    Patient will be I with final HEP to maintain progress from PT. Baseline: HEP provided at eval Goal status: INITIAL   2.  Patient will report LEFS >/= 57/80 in order to indicate improved ability to perform household and work related tasks that require walking, squatting, sitting, standing Baseline: LEFS 13/80 Goal status: INITIAL   3.  Patient will demonstrate right knee strength 5/5 MMT in order to improve ability to go up/down stairs without limitation. Baseline: right knee strength grossly 4-/5 MMT with pain Goal status: INITIAL   4.  Patient will report right knee pain level with all standing, walking, and squatting activities </=  4/10 pain in order to allow him to return to work without limitation. Baseline: patient reports pain >10/10 Goal status: INITIAL     PLAN: PT FREQUENCY: 1x/week   PT DURATION: 8 weeks   PLANNED INTERVENTIONS: Therapeutic exercises, Therapeutic activity, Neuromuscular re-education, Balance training, Gait training, Patient/Family education, Self Care, Joint mobilization, Joint manipulation, Aquatic Therapy, Dry Needling, Cryotherapy, Moist heat, Taping, Manual therapy, and  Re-evaluation   PLAN FOR NEXT SESSION: Review HEP and progress PRN, manual for hamstring/calf region and gentle stretching, gradual strength progression for hamstring and calf, progress to close chain exercises as able; assess response to extension   Gwendolyn Grant, PT, DPT, ATC 05/13/22 9:24 AM  Gwendolyn Grant, PT, DPT,  ATC 07/08/22 1:40 PM

## 2022-05-20 ENCOUNTER — Ambulatory Visit: Payer: Medicaid Other | Admitting: Physical Therapy

## 2022-05-25 IMAGING — CT CT RENAL STONE PROTOCOL
2 of 4 series · 16 of 46 positions shown, 18 images · non-contrast
Comparison: None

CLINICAL DATA: Right flank pain

EXAM:
CT ABDOMEN AND PELVIS WITHOUT CONTRAST
TECHNIQUE: Multidetector CT imaging of the abdomen and pelvis was performed
following the standard protocol without IV contrast.

[Series 3: ap without · axial · non-contrast · 0.77mm/px · z∈[+746,+1182]mm · 13 of 97 slices shown, 15 images]
[im 5/97  soft-tissue]
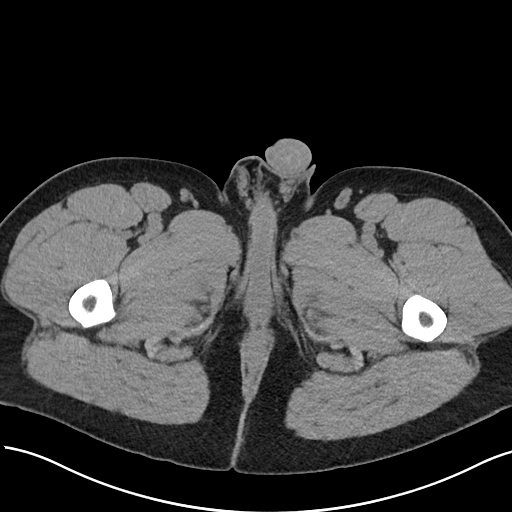
[im 5/97  bone]
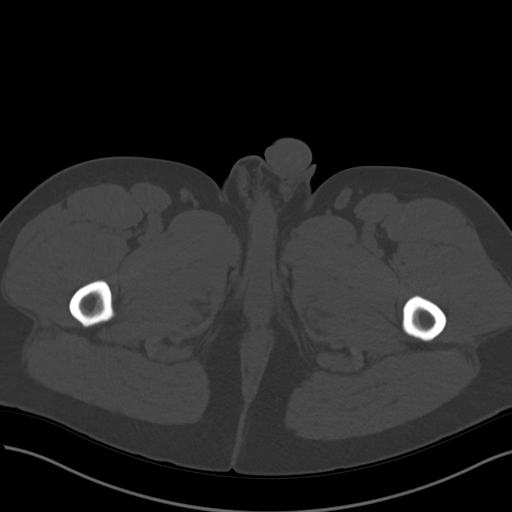
[im 15/97  soft-tissue]
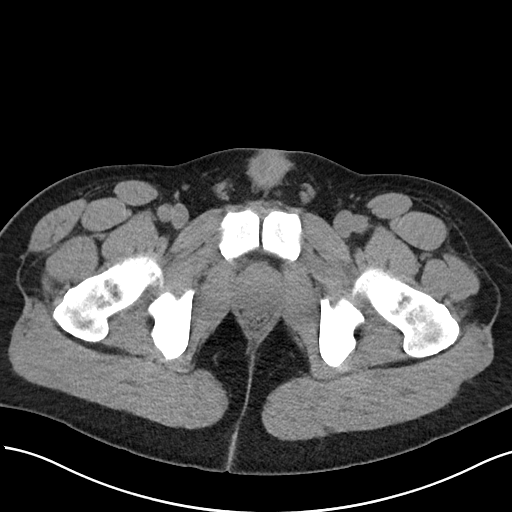
[im 20/97  soft-tissue]
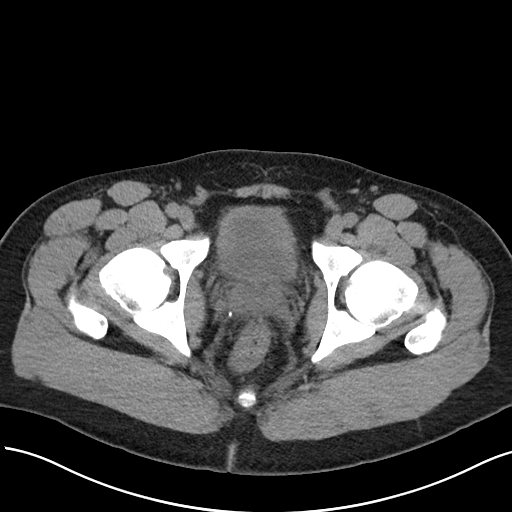
[im 29/97  soft-tissue]
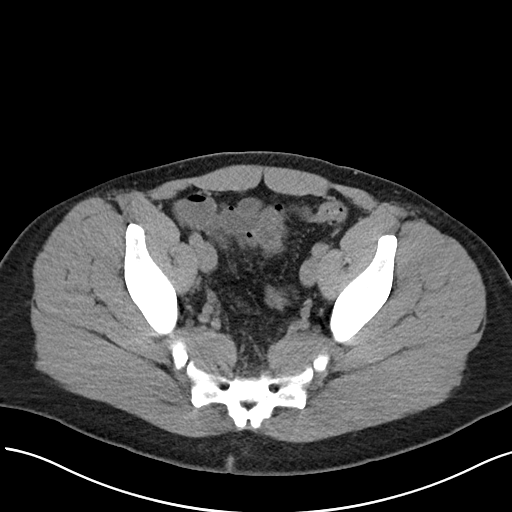
[im 34/97  soft-tissue]
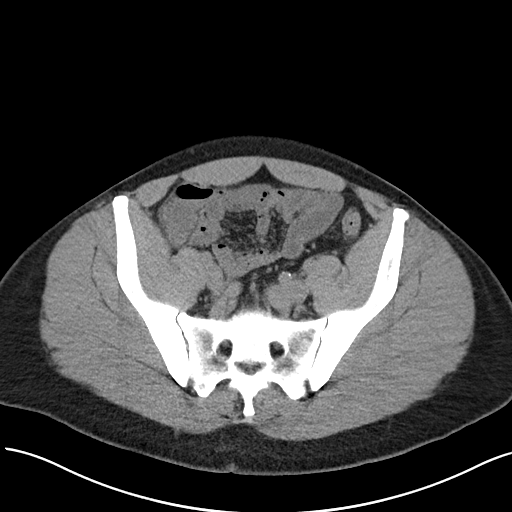
[im 44/97  soft-tissue]
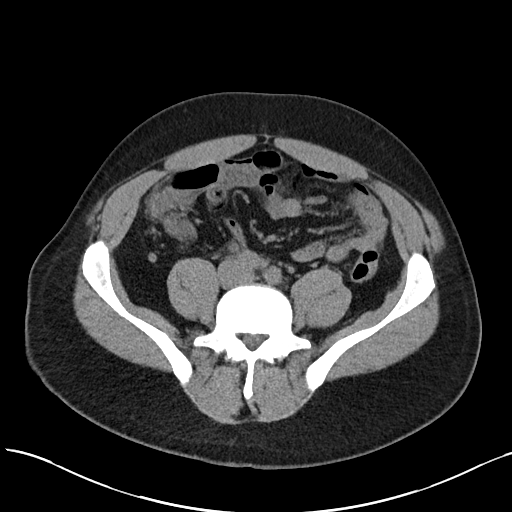
[im 49/97  soft-tissue]
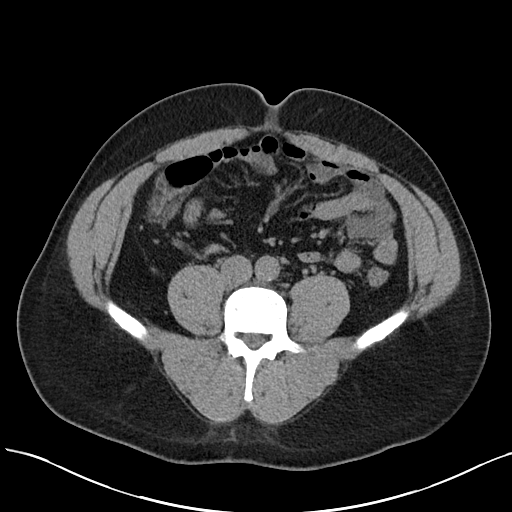
[im 53/97  soft-tissue]
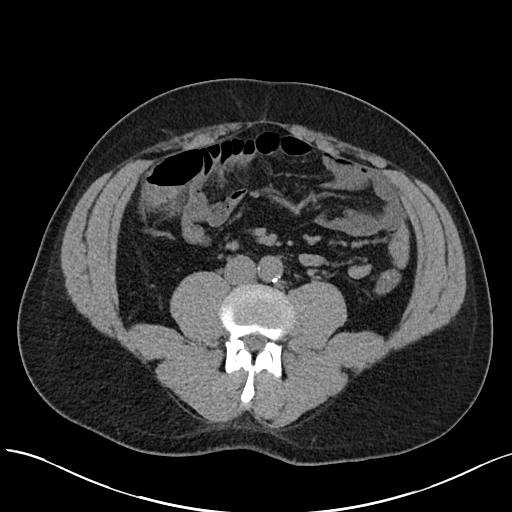
[im 63/97  soft-tissue]
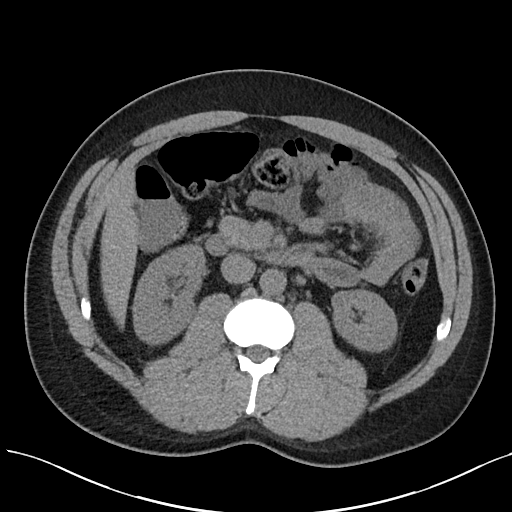
[im 63/97  bone]
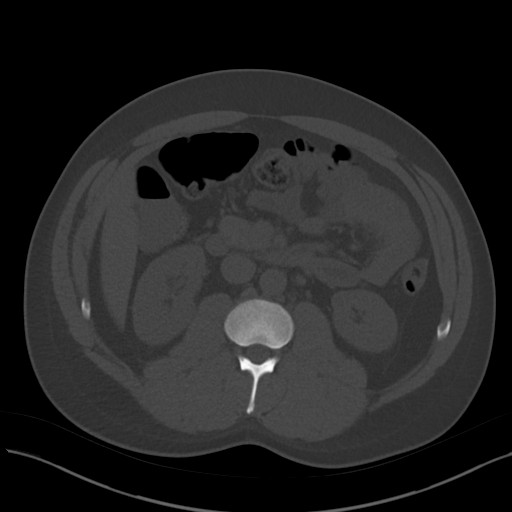
[im 68/97  soft-tissue]
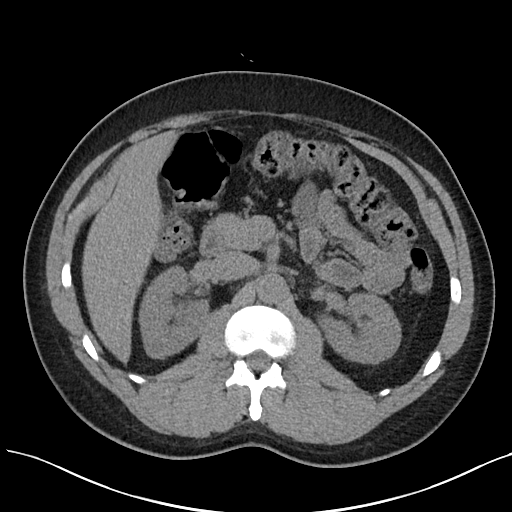
[im 77/97  soft-tissue]
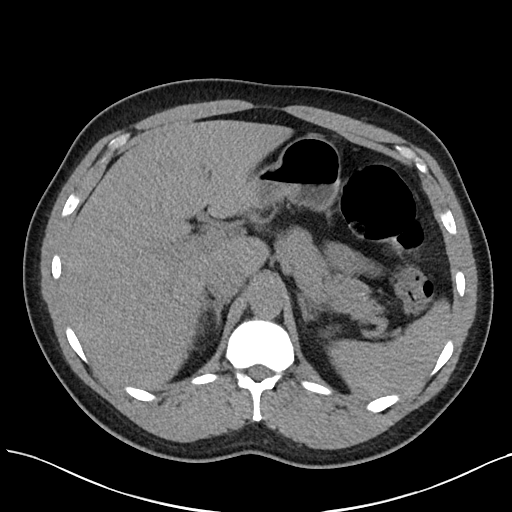
[im 82/97  soft-tissue]
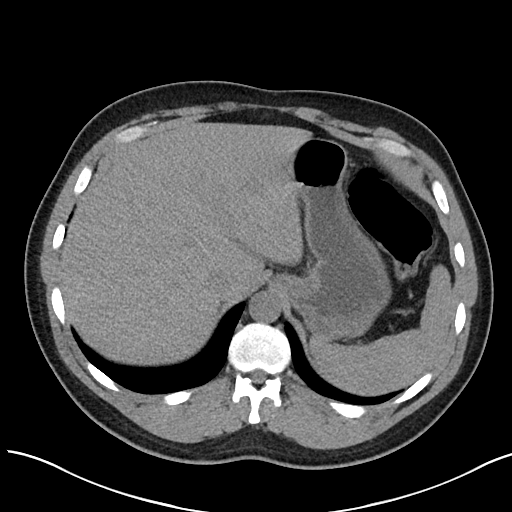
[im 92/97  soft-tissue]
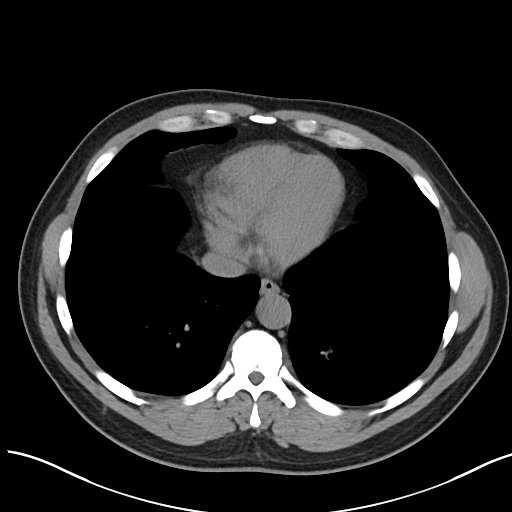

[Series 6: cor · coronal · 0.73mm/px · 3 of 104 slices shown]
[im 35/104  soft-tissue]
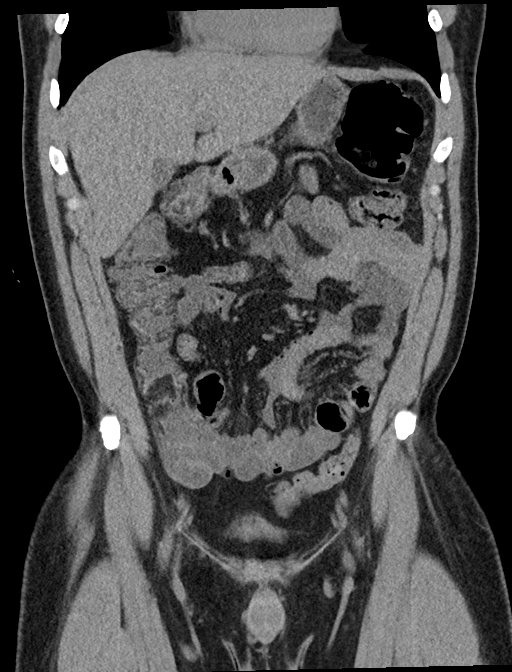
[im 46/104  soft-tissue]
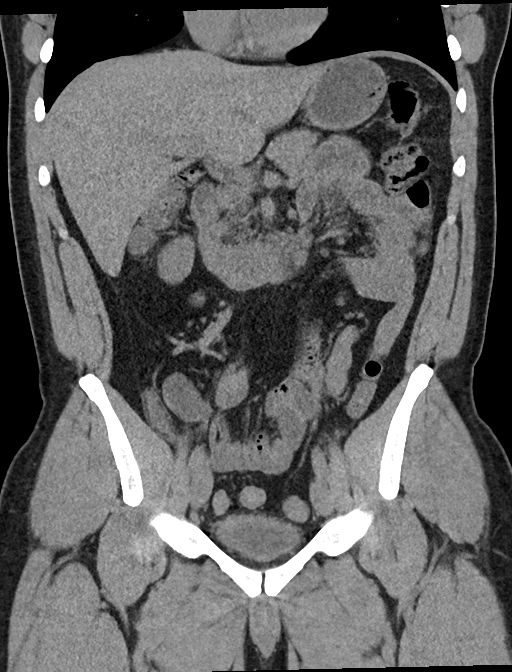
[im 58/104  soft-tissue]
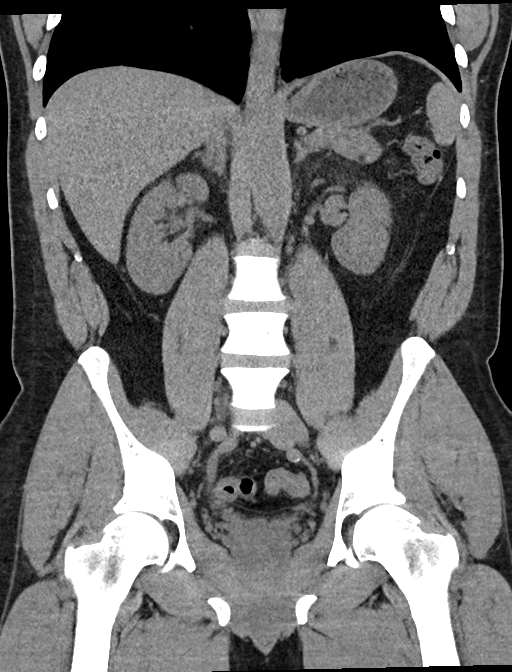

[16 of 46 positions shown; findings below may reference images not displayed]

FINDINGS: Lower chest: Lung bases are clear. No effusions. Heart is normal
size.

Hepatobiliary: Small gallstone versus focal calcification within the
gallbladder wall near the fundus. No focal hepatic abnormality.

Pancreas: Few calcifications within the pancreatic tail compatible
with chronic pancreatitis.

Spleen: No focal abnormality.  Normal size.

Adrenals/Urinary Tract: No adrenal abnormality. No focal renal
abnormality. No stones or hydronephrosis. Urinary bladder is
unremarkable.

Stomach/Bowel: Stomach, large and small bowel grossly unremarkable.
Normal appendix.

Vascular/Lymphatic: Aortic atherosclerosis. No evidence of aneurysm
or adenopathy.

Reproductive: No visible focal abnormality.

Other: No free fluid or free air.

Musculoskeletal: No acute bony abnormality.
IMPRESSION: Calcification near the fundus of the gallbladder could reflect a
small stone or focal wall calcification.

No renal or ureteral stones.  No hydronephrosis.

Aortic atherosclerosis.

No acute findings.

## 2022-05-27 ENCOUNTER — Telehealth: Payer: Self-pay | Admitting: Physical Therapy

## 2022-05-27 ENCOUNTER — Ambulatory Visit: Payer: Medicaid Other | Attending: Internal Medicine | Admitting: Physical Therapy

## 2022-05-27 NOTE — Telephone Encounter (Signed)
Attempted to contact patient due to missed PT appointment. Left VM informing patient of missed appointment and attendance policy.  Hilda Blades, PT, DPT, LAT, ATC 05/27/22  9:01 AM Phone: [phone] Fax: [phone]

## 2022-07-08 ENCOUNTER — Encounter: Payer: Medicaid Other | Admitting: Student

## 2022-09-03 DIAGNOSIS — H5022 Vertical strabismus, left eye: Secondary | ICD-10-CM | POA: Diagnosis not present

## 2022-10-30 DIAGNOSIS — H5022 Vertical strabismus, left eye: Secondary | ICD-10-CM | POA: Diagnosis not present

## 2022-10-30 DIAGNOSIS — H532 Diplopia: Secondary | ICD-10-CM | POA: Diagnosis not present

## 2022-10-30 DIAGNOSIS — S0285XD Fracture of orbit, unspecified, subsequent encounter for fracture with routine healing: Secondary | ICD-10-CM | POA: Diagnosis not present

## 2022-11-12 DIAGNOSIS — S0285XD Fracture of orbit, unspecified, subsequent encounter for fracture with routine healing: Secondary | ICD-10-CM | POA: Diagnosis not present

## 2022-11-12 DIAGNOSIS — H5712 Ocular pain, left eye: Secondary | ICD-10-CM | POA: Diagnosis not present

## 2022-11-12 DIAGNOSIS — H05402 Unspecified enophthalmos, left eye: Secondary | ICD-10-CM | POA: Diagnosis not present

## 2023-01-25 DIAGNOSIS — Z419 Encounter for procedure for purposes other than remedying health state, unspecified: Secondary | ICD-10-CM | POA: Diagnosis not present

## 2023-02-06 DIAGNOSIS — S0232XG Fracture of orbital floor, left side, subsequent encounter for fracture with delayed healing: Secondary | ICD-10-CM | POA: Diagnosis not present

## 2023-02-24 DIAGNOSIS — Z419 Encounter for procedure for purposes other than remedying health state, unspecified: Secondary | ICD-10-CM | POA: Diagnosis not present

## 2023-03-27 DIAGNOSIS — Z419 Encounter for procedure for purposes other than remedying health state, unspecified: Secondary | ICD-10-CM | POA: Diagnosis not present

## 2023-04-23 DIAGNOSIS — S0232XD Fracture of orbital floor, left side, subsequent encounter for fracture with routine healing: Secondary | ICD-10-CM | POA: Diagnosis not present

## 2023-04-23 DIAGNOSIS — F17210 Nicotine dependence, cigarettes, uncomplicated: Secondary | ICD-10-CM | POA: Diagnosis not present

## 2023-04-27 DIAGNOSIS — Z419 Encounter for procedure for purposes other than remedying health state, unspecified: Secondary | ICD-10-CM | POA: Diagnosis not present

## 2023-05-27 DIAGNOSIS — Z419 Encounter for procedure for purposes other than remedying health state, unspecified: Secondary | ICD-10-CM | POA: Diagnosis not present

## 2023-05-30 DIAGNOSIS — X58XXXD Exposure to other specified factors, subsequent encounter: Secondary | ICD-10-CM | POA: Diagnosis not present

## 2023-05-30 DIAGNOSIS — S0232XD Fracture of orbital floor, left side, subsequent encounter for fracture with routine healing: Secondary | ICD-10-CM | POA: Diagnosis not present

## 2023-06-27 DIAGNOSIS — Z419 Encounter for procedure for purposes other than remedying health state, unspecified: Secondary | ICD-10-CM | POA: Diagnosis not present

## 2023-07-27 DIAGNOSIS — Z419 Encounter for procedure for purposes other than remedying health state, unspecified: Secondary | ICD-10-CM | POA: Diagnosis not present

## 2023-07-30 DIAGNOSIS — M5416 Radiculopathy, lumbar region: Secondary | ICD-10-CM | POA: Diagnosis not present

## 2023-07-30 DIAGNOSIS — M47816 Spondylosis without myelopathy or radiculopathy, lumbar region: Secondary | ICD-10-CM | POA: Diagnosis not present

## 2023-07-30 DIAGNOSIS — M542 Cervicalgia: Secondary | ICD-10-CM | POA: Diagnosis not present

## 2023-07-30 DIAGNOSIS — G8929 Other chronic pain: Secondary | ICD-10-CM | POA: Diagnosis not present

## 2023-07-30 DIAGNOSIS — M549 Dorsalgia, unspecified: Secondary | ICD-10-CM | POA: Diagnosis not present

## 2023-08-18 DIAGNOSIS — M47816 Spondylosis without myelopathy or radiculopathy, lumbar region: Secondary | ICD-10-CM | POA: Diagnosis not present

## 2023-08-18 DIAGNOSIS — M5416 Radiculopathy, lumbar region: Secondary | ICD-10-CM | POA: Diagnosis not present

## 2023-08-27 DIAGNOSIS — Z419 Encounter for procedure for purposes other than remedying health state, unspecified: Secondary | ICD-10-CM | POA: Diagnosis not present

## 2023-09-01 DIAGNOSIS — M5442 Lumbago with sciatica, left side: Secondary | ICD-10-CM | POA: Diagnosis not present

## 2023-09-01 DIAGNOSIS — M5441 Lumbago with sciatica, right side: Secondary | ICD-10-CM | POA: Diagnosis not present

## 2023-09-11 DIAGNOSIS — M47816 Spondylosis without myelopathy or radiculopathy, lumbar region: Secondary | ICD-10-CM | POA: Diagnosis not present

## 2023-09-11 DIAGNOSIS — M549 Dorsalgia, unspecified: Secondary | ICD-10-CM | POA: Diagnosis not present

## 2023-09-27 DIAGNOSIS — Z419 Encounter for procedure for purposes other than remedying health state, unspecified: Secondary | ICD-10-CM | POA: Diagnosis not present

## 2023-10-25 DIAGNOSIS — Z419 Encounter for procedure for purposes other than remedying health state, unspecified: Secondary | ICD-10-CM | POA: Diagnosis not present

## 2023-11-24 DIAGNOSIS — M549 Dorsalgia, unspecified: Secondary | ICD-10-CM | POA: Diagnosis not present

## 2023-11-24 DIAGNOSIS — M47816 Spondylosis without myelopathy or radiculopathy, lumbar region: Secondary | ICD-10-CM | POA: Diagnosis not present

## 2023-12-06 DIAGNOSIS — Z419 Encounter for procedure for purposes other than remedying health state, unspecified: Secondary | ICD-10-CM | POA: Diagnosis not present

## 2023-12-09 DIAGNOSIS — M545 Low back pain, unspecified: Secondary | ICD-10-CM | POA: Diagnosis not present

## 2023-12-09 DIAGNOSIS — M47816 Spondylosis without myelopathy or radiculopathy, lumbar region: Secondary | ICD-10-CM | POA: Diagnosis not present

## 2023-12-12 DIAGNOSIS — M47816 Spondylosis without myelopathy or radiculopathy, lumbar region: Secondary | ICD-10-CM | POA: Diagnosis not present

## 2023-12-12 DIAGNOSIS — H05402 Unspecified enophthalmos, left eye: Secondary | ICD-10-CM | POA: Diagnosis not present

## 2023-12-12 DIAGNOSIS — Z01818 Encounter for other preprocedural examination: Secondary | ICD-10-CM | POA: Diagnosis not present

## 2023-12-12 DIAGNOSIS — R001 Bradycardia, unspecified: Secondary | ICD-10-CM | POA: Diagnosis not present

## 2023-12-25 DIAGNOSIS — X58XXXA Exposure to other specified factors, initial encounter: Secondary | ICD-10-CM | POA: Diagnosis not present

## 2023-12-25 DIAGNOSIS — H05412 Enophthalmos due to atrophy of orbital tissue, left eye: Secondary | ICD-10-CM | POA: Diagnosis not present

## 2023-12-25 DIAGNOSIS — D649 Anemia, unspecified: Secondary | ICD-10-CM | POA: Diagnosis not present

## 2023-12-25 DIAGNOSIS — S0232XA Fracture of orbital floor, left side, initial encounter for closed fracture: Secondary | ICD-10-CM | POA: Diagnosis not present

## 2024-01-05 DIAGNOSIS — Z419 Encounter for procedure for purposes other than remedying health state, unspecified: Secondary | ICD-10-CM | POA: Diagnosis not present

## 2024-01-07 DIAGNOSIS — H05402 Unspecified enophthalmos, left eye: Secondary | ICD-10-CM | POA: Diagnosis not present

## 2024-01-23 DIAGNOSIS — Z681 Body mass index (BMI) 19 or less, adult: Secondary | ICD-10-CM | POA: Diagnosis not present

## 2024-01-23 DIAGNOSIS — Z113 Encounter for screening for infections with a predominantly sexual mode of transmission: Secondary | ICD-10-CM | POA: Diagnosis not present

## 2024-02-03 DIAGNOSIS — H5710 Ocular pain, unspecified eye: Secondary | ICD-10-CM | POA: Diagnosis not present

## 2024-02-03 DIAGNOSIS — H04123 Dry eye syndrome of bilateral lacrimal glands: Secondary | ICD-10-CM | POA: Diagnosis not present

## 2024-02-05 DIAGNOSIS — Z419 Encounter for procedure for purposes other than remedying health state, unspecified: Secondary | ICD-10-CM | POA: Diagnosis not present

## 2024-02-13 DIAGNOSIS — Z113 Encounter for screening for infections with a predominantly sexual mode of transmission: Secondary | ICD-10-CM | POA: Diagnosis not present

## 2024-02-13 DIAGNOSIS — Z202 Contact with and (suspected) exposure to infections with a predominantly sexual mode of transmission: Secondary | ICD-10-CM | POA: Diagnosis not present

## 2024-02-18 DIAGNOSIS — H04123 Dry eye syndrome of bilateral lacrimal glands: Secondary | ICD-10-CM | POA: Diagnosis not present

## 2024-03-06 DIAGNOSIS — Z419 Encounter for procedure for purposes other than remedying health state, unspecified: Secondary | ICD-10-CM | POA: Diagnosis not present

## 2024-03-09 ENCOUNTER — Encounter: Payer: Self-pay | Admitting: *Deleted

## 2024-03-17 DIAGNOSIS — Z7689 Persons encountering health services in other specified circumstances: Secondary | ICD-10-CM | POA: Diagnosis not present

## 2024-03-17 DIAGNOSIS — H04122 Dry eye syndrome of left lacrimal gland: Secondary | ICD-10-CM | POA: Diagnosis not present

## 2024-03-17 DIAGNOSIS — R030 Elevated blood-pressure reading, without diagnosis of hypertension: Secondary | ICD-10-CM | POA: Diagnosis not present

## 2024-03-31 DIAGNOSIS — G500 Trigeminal neuralgia: Secondary | ICD-10-CM | POA: Diagnosis not present

## 2024-04-06 DIAGNOSIS — Z419 Encounter for procedure for purposes other than remedying health state, unspecified: Secondary | ICD-10-CM | POA: Diagnosis not present

## 2024-04-14 DIAGNOSIS — R208 Other disturbances of skin sensation: Secondary | ICD-10-CM | POA: Diagnosis not present

## 2024-04-14 DIAGNOSIS — G8929 Other chronic pain: Secondary | ICD-10-CM | POA: Diagnosis not present

## 2024-04-14 DIAGNOSIS — M5116 Intervertebral disc disorders with radiculopathy, lumbar region: Secondary | ICD-10-CM | POA: Diagnosis not present

## 2024-04-14 DIAGNOSIS — M5126 Other intervertebral disc displacement, lumbar region: Secondary | ICD-10-CM | POA: Diagnosis not present

## 2024-04-14 DIAGNOSIS — M79604 Pain in right leg: Secondary | ICD-10-CM | POA: Diagnosis not present

## 2024-05-07 DIAGNOSIS — Z419 Encounter for procedure for purposes other than remedying health state, unspecified: Secondary | ICD-10-CM | POA: Diagnosis not present

## 2024-05-18 DIAGNOSIS — M47816 Spondylosis without myelopathy or radiculopathy, lumbar region: Secondary | ICD-10-CM | POA: Diagnosis not present

## 2024-07-07 DIAGNOSIS — Z419 Encounter for procedure for purposes other than remedying health state, unspecified: Secondary | ICD-10-CM | POA: Diagnosis not present

## 2024-07-26 DIAGNOSIS — M479 Spondylosis, unspecified: Secondary | ICD-10-CM | POA: Diagnosis not present

## 2024-08-06 DIAGNOSIS — Z419 Encounter for procedure for purposes other than remedying health state, unspecified: Secondary | ICD-10-CM | POA: Diagnosis not present
